# Patient Record
Sex: Female | Born: 1970 | Race: White | Hispanic: No | Marital: Married | Smoking: Never smoker
Health system: Southern US, Community
[De-identification: ages and names within clinical notes are randomized; demographics above are authoritative.]

## PROBLEM LIST (undated history)

## (undated) DIAGNOSIS — R000 Tachycardia, unspecified: Secondary | ICD-10-CM

## (undated) DIAGNOSIS — J45909 Unspecified asthma, uncomplicated: Secondary | ICD-10-CM

## (undated) DIAGNOSIS — E039 Hypothyroidism, unspecified: Secondary | ICD-10-CM

## (undated) DIAGNOSIS — H1010 Acute atopic conjunctivitis, unspecified eye: Secondary | ICD-10-CM

## (undated) DIAGNOSIS — J309 Allergic rhinitis, unspecified: Secondary | ICD-10-CM

## (undated) HISTORY — DX: Hypothyroidism, unspecified: E03.9

## (undated) HISTORY — DX: Acute atopic conjunctivitis, unspecified eye: H10.10

## (undated) HISTORY — DX: Allergic rhinitis, unspecified: J30.9

## (undated) HISTORY — DX: Tachycardia, unspecified: R00.0

## (undated) HISTORY — DX: Unspecified asthma, uncomplicated: J45.909

## (undated) HISTORY — PX: REDUCTION MAMMAPLASTY: SUR839

## (undated) HISTORY — PX: BREAST REDUCTION SURGERY: SHX8

## (undated) HISTORY — PX: OTHER SURGICAL HISTORY: SHX169

## (undated) HISTORY — PX: BREAST EXCISIONAL BIOPSY: SUR124

---

## 2003-04-14 ENCOUNTER — Other Ambulatory Visit: Admission: RE | Admit: 2003-04-14 | Discharge: 2003-04-14 | Payer: Self-pay | Admitting: Gynecology

## 2003-10-17 ENCOUNTER — Inpatient Hospital Stay (HOSPITAL_COMMUNITY): Admission: AD | Admit: 2003-10-17 | Discharge: 2003-10-21 | Payer: Self-pay | Admitting: Gynecology

## 2003-10-24 ENCOUNTER — Encounter: Admission: RE | Admit: 2003-10-24 | Discharge: 2003-11-23 | Payer: Self-pay | Admitting: Gynecology

## 2003-11-29 ENCOUNTER — Other Ambulatory Visit: Admission: RE | Admit: 2003-11-29 | Discharge: 2003-11-29 | Payer: Self-pay | Admitting: Gynecology

## 2003-12-22 ENCOUNTER — Encounter: Admission: RE | Admit: 2003-12-22 | Discharge: 2004-01-21 | Payer: Self-pay | Admitting: Gynecology

## 2005-01-03 ENCOUNTER — Other Ambulatory Visit: Admission: RE | Admit: 2005-01-03 | Discharge: 2005-01-03 | Payer: Self-pay | Admitting: Gynecology

## 2005-05-09 ENCOUNTER — Encounter: Admission: RE | Admit: 2005-05-09 | Discharge: 2005-05-09 | Payer: Self-pay | Admitting: Gynecology

## 2006-01-23 ENCOUNTER — Other Ambulatory Visit: Admission: RE | Admit: 2006-01-23 | Discharge: 2006-01-23 | Payer: Self-pay | Admitting: Gynecology

## 2006-05-15 ENCOUNTER — Encounter: Admission: RE | Admit: 2006-05-15 | Discharge: 2006-05-15 | Payer: Self-pay | Admitting: General Surgery

## 2007-02-01 ENCOUNTER — Other Ambulatory Visit: Admission: RE | Admit: 2007-02-01 | Discharge: 2007-02-01 | Payer: Self-pay | Admitting: Gynecology

## 2007-06-21 ENCOUNTER — Encounter: Admission: RE | Admit: 2007-06-21 | Discharge: 2007-06-21 | Payer: Self-pay | Admitting: General Surgery

## 2008-06-26 ENCOUNTER — Encounter: Admission: RE | Admit: 2008-06-26 | Discharge: 2008-06-26 | Payer: Self-pay | Admitting: General Surgery

## 2009-07-16 ENCOUNTER — Encounter: Admission: RE | Admit: 2009-07-16 | Discharge: 2009-07-16 | Payer: Self-pay | Admitting: Family Medicine

## 2010-09-30 ENCOUNTER — Encounter
Admission: RE | Admit: 2010-09-30 | Discharge: 2010-09-30 | Payer: Self-pay | Source: Home / Self Care | Attending: Internal Medicine | Admitting: Internal Medicine

## 2011-01-17 NOTE — Discharge Summary (Signed)
NAME:  Barbara Jordan, Barbara Jordan NO.:  [id number]   MEDICAL RECORD NO.:  [id number]                   PATIENT TYPE:  INP   LOCATION:  9108                                 FACILITY:  [facility]   PHYSICIAN:  Timothy P. Fontaine, M.D.           DATE OF BIRTH:  05/07/1971   DATE OF ADMISSION:  10/17/2003  DATE OF DISCHARGE:  10/21/2003                                 DISCHARGE SUMMARY   DISCHARGE DIAGNOSES:  1. Intrauterine pregnancy at 39+ weeks, delivered.  2. Arrest of dilatation and descent.  3. Status post primary cesarean section low flap transverse by Dr. Douglass Rivers on October 18, 2003.  4. Positive group B Streptococcus.   HISTORY:  This is a 31-years-of-age female gravida 1 with an EDC of October 20, 2003.  Prenatal course had been uncomplicated.  The patient presented  for elective induction of labor for social reasons as the patient had been  asked to leave her job because of pregnancy at 38 weeks and currently using  up her maternity leave and did not go into spontaneous labor at that point.   HOSPITAL COURSE:  On October 16, 2003 the patient was admitted that p.m.  Cervidil was placed.  The following a.m. it was removed and begun on  Pitocin.  She was also begun on clindamycin per protocol for positive group  B strep.  Subsequently, however, the diagnosis of arrest of dilatation and  descent was made and therefore the patient underwent a primary cesarean  section low flap transverse by Dr. Douglass Rivers on October 18, 2003 and  underwent delivery of a female, Apgars of 9 and 9, weight of 6 pounds 9  ounces.  There were no complications.  Postoperatively the patient remained  afebrile, voiding, in stable condition, and on October 21, 2003 the patient  was discharged to home in satisfactory condition and given [facility]  Gynecology postpartum instructions and postpartum booklet.   ACCESSORY CLINICAL FINDINGS/LABORATORY DATA:  The patient is A  positive,  rubella immune.  On October 19, 2003 hemoglobin 7.5.   DISPOSITION:  The patient was discharged to home, informed to return to the  office in 6 weeks, given a prescription of Tylox p.r.n. pain.     Susa Loffler, P.A.                    Timothy P. Audie Box, M.D.    Ardath Sax  D:  11/20/2003  T:  11/20/2003  Job:  [id number]

## 2011-01-17 NOTE — H&P (Signed)
NAME:  TRINI, SOLDO NO.:  [id number]   MEDICAL RECORD NO.:  [id number]                   PATIENT TYPE:   LOCATION:                                       FACILITY:   PHYSICIAN:  Ivor Costa. Farrel Gobble, M.D.              DATE OF BIRTH:   DATE OF ADMISSION:  DATE OF DISCHARGE:                                HISTORY & PHYSICAL   CHIEF COMPLAINT:  Term pregnancy.   HISTORY OF PRESENT ILLNESS:  The patient is a 40 year old G1 with an LMP of  Jan 14, 2003; estimated date of confinement of October 20, 2003; estimated  gestational age of 51 and five-sevenths weeks who presents for an elective  induction of labor for social reasons as the patient has been asked to leave  her job because of the pregnancy at 38 weeks and is currently using up her  maternity leave and has not gone into spontaneous labor at this point.  The  patient reports good fetal movement, no vaginal bleeding, no contractions.  Her pregnancy was without any complications.   PRENATAL LABORATORY DATA:  She is O positive, antibody negative.  RPR  nonreactive.  Rubella immune.  Hepatitis B surface antigen nonreactive.  HIV  nonreactive.  GBS positive.  AFP within normal limits.   Of note, the patient has a LATEX ALLERGY.   Please refer to the [facility].   PHYSICAL EXAMINATION:  GENERAL:  The patient is a well-appearing gravida.  VITAL SIGNS:  Her weight is 176 which is a 30-pound weight gain.  Urine dip  was negative.  Blood pressure was 110/72.  HEART:  Regular rate.  LUNGS:  Clear to auscultation.  ABDOMEN:  Gravid, soft and nontender, with heart tones auscultated.  Fundal  height was 37.  PELVIC:  On vaginal exam she was fingertip, 50%, mid position, and vertex.  EXTREMITIES:  Negative.   ASSESSMENT:  Elective induction secondary to loss of wages.  The patient  will present for Cervidil this evening, followed by high-dose Pitocin in the  morning.  Risks and benefits of induction were  discussed and accepted.                                               Ivor Costa. Farrel Gobble, M.D.    THL/MEDQ  D:  10/17/2003  T:  10/17/2003  Job:  [id number]

## 2011-01-17 NOTE — Op Note (Signed)
NAME:  Barbara Jordan, Barbara Jordan NO.:  [id number]   MEDICAL RECORD NO.:  [id number]                   PATIENT TYPE:  INP   LOCATION:  9108                                 [facility]ACILITY:  [facility]   PHYSICIAN:  Ivor Costa. Farrel Gobble, M.D.              DATE OF BIRTH:  Jan 27, 1971   DATE OF PROCEDURE:  10/18/2003  DATE OF DISCHARGE:                                 OPERATIVE REPORT   PREOPERATIVE DIAGNOSIS:  Arrest of dilation and descent.   POSTOPERATIVE DIAGNOSIS:  Arrest of dilation and descent.   PROCEDURE:  Primary cesarean section, low flap transverse.   SURGEON:  Ivor Costa. Farrel Gobble, M.D.   ANESTHESIA:  Epidural.   FLUIDS REPLACED:  1200 mL lactated Ringer's.   ESTIMATED BLOOD LOSS:  400 mL.   URINE OUTPUT:  200 mL clear urine.   FINDINGS:  A viable female infant in the OP presentation, Apgars 8 and 9,  birth weight 6 pound 9 ounces.  Normal uterus, tubes, and ovaries.   COMPLICATIONS:  None.   PATHOLOGY:  None.   DESCRIPTION OF PROCEDURE:  The patient was taken to the operating room and  placed in the supine position with left lateral displacement, prepped and  draped in the usual sterile fashion.  After adequate anesthesia was ensured,  a Pfannenstiel skin incision was made with a scalpel and carried to the  underlying layer of fascia with electrocautery.  The fascia was scored in  the midline and the incision was extended laterally with electrocautery.  The inferior aspect of the fascial incision was grasped with Kochers.  Underlying rectus muscles were dissected off with blunt and sharp  dissection.  In a similar fashion the superior aspect of the incision was  grasped with Kochers, the underlying rectus muscles were dissected off.  The  rectus muscles were separated in the midline and the peritoneum was  identified and entered bluntly.  The peritoneal incision was then extended  superiorly and inferiorly with good visualization of the underlying bowel  and  bladder.  The orientation of the uterus was confirmed.  The bladder  blade was inserted, the vesicouterine peritoneum was identified and entered  sharply with the Metzenbaums.  The incision was extended laterally, the  bladder flap was created digitally, the bladder blade was then reinserted.  The lower uterine segment was incised in a transverse fashion with a scalpel  and noted to be markedly thinned.  The incision was extended bluntly.  The  infant was delivered from the direct OP presentation.  The cord was clamped  and cut and handed off to the awaiting pediatricians.  Spontaneous cries  were noted.  Cord bloods were obtained.  The uterus was massaged and the  placenta was allowed to separate naturally.  The uterus was cleared of all  clots and debris.  The uterine incision was then repaired with a running  locked layer of 0 chromic and noted to  be hemostatic afterwards.  The pelvis  was irrigated with copious amounts of warm saline.  The adnexa were  inspected and noted to be unremarkable.  The gutters were cleared of all  clots and debris.  Inspection of the remainder of the pelvis assured [place] of  hemostasis.  The peritoneum and muscles were also inspected and noted to be  hemostatic.  The fascia was then closed with 0 Vicryl in a running fashion.  The subcu was irrigated.  The skin was closed with staples.  The patient  tolerated the procedure well.  The sponge, lap, and needle counts were  correct x2, and she was transferred to the PACU in stable condition.  The  patient received Cipro preoperatively.                                               Ivor Costa. Farrel Gobble, M.D.    THL/MEDQ  D:  10/18/2003  T:  10/19/2003  Job:  [id number]

## 2011-10-24 ENCOUNTER — Other Ambulatory Visit: Payer: Self-pay | Admitting: Obstetrics and Gynecology

## 2011-10-24 DIAGNOSIS — Z1231 Encounter for screening mammogram for malignant neoplasm of breast: Secondary | ICD-10-CM

## 2011-11-20 ENCOUNTER — Ambulatory Visit
Admission: RE | Admit: 2011-11-20 | Discharge: 2011-11-20 | Disposition: A | Discharge status: Home / Self Care | Payer: PRIVATE HEALTH INSURANCE | Source: Ambulatory Visit | Attending: Obstetrics and Gynecology | Admitting: Obstetrics and Gynecology

## 2011-11-20 DIAGNOSIS — Z1231 Encounter for screening mammogram for malignant neoplasm of breast: Secondary | ICD-10-CM

## 2012-10-20 ENCOUNTER — Other Ambulatory Visit: Payer: Self-pay | Admitting: Obstetrics and Gynecology

## 2012-12-24 ENCOUNTER — Ambulatory Visit: Payer: PRIVATE HEALTH INSURANCE

## 2013-12-02 ENCOUNTER — Ambulatory Visit: Payer: PRIVATE HEALTH INSURANCE

## 2013-12-07 ENCOUNTER — Ambulatory Visit
Admission: RE | Admit: 2013-12-07 | Discharge: 2013-12-07 | Disposition: A | Discharge status: Home / Self Care | Payer: PRIVATE HEALTH INSURANCE | Source: Ambulatory Visit | Attending: Obstetrics and Gynecology | Admitting: Obstetrics and Gynecology

## 2013-12-07 DIAGNOSIS — Z1231 Encounter for screening mammogram for malignant neoplasm of breast: Secondary | ICD-10-CM

## 2015-02-06 ENCOUNTER — Other Ambulatory Visit: Payer: Self-pay

## 2015-02-06 DIAGNOSIS — Z1231 Encounter for screening mammogram for malignant neoplasm of breast: Secondary | ICD-10-CM

## 2015-03-09 ENCOUNTER — Ambulatory Visit
Admission: RE | Admit: 2015-03-09 | Discharge: 2015-03-09 | Disposition: A | Discharge status: Home / Self Care | Payer: PRIVATE HEALTH INSURANCE | Source: Ambulatory Visit

## 2015-03-09 DIAGNOSIS — Z1231 Encounter for screening mammogram for malignant neoplasm of breast: Secondary | ICD-10-CM

## 2015-06-01 ENCOUNTER — Ambulatory Visit (INDEPENDENT_AMBULATORY_CARE_PROVIDER_SITE_OTHER): Payer: PRIVATE HEALTH INSURANCE

## 2015-06-01 DIAGNOSIS — J3089 Other allergic rhinitis: Secondary | ICD-10-CM | POA: Diagnosis not present

## 2015-06-11 ENCOUNTER — Ambulatory Visit (INDEPENDENT_AMBULATORY_CARE_PROVIDER_SITE_OTHER): Payer: PRIVATE HEALTH INSURANCE

## 2015-06-11 DIAGNOSIS — J309 Allergic rhinitis, unspecified: Secondary | ICD-10-CM | POA: Diagnosis not present

## 2015-06-21 ENCOUNTER — Ambulatory Visit (INDEPENDENT_AMBULATORY_CARE_PROVIDER_SITE_OTHER): Payer: PRIVATE HEALTH INSURANCE | Admitting: *Deleted

## 2015-06-21 DIAGNOSIS — J309 Allergic rhinitis, unspecified: Secondary | ICD-10-CM | POA: Diagnosis not present

## 2015-06-29 ENCOUNTER — Ambulatory Visit (INDEPENDENT_AMBULATORY_CARE_PROVIDER_SITE_OTHER): Payer: PRIVATE HEALTH INSURANCE | Admitting: *Deleted

## 2015-06-29 DIAGNOSIS — J309 Allergic rhinitis, unspecified: Secondary | ICD-10-CM | POA: Diagnosis not present

## 2015-07-05 DIAGNOSIS — J3089 Other allergic rhinitis: Secondary | ICD-10-CM | POA: Diagnosis not present

## 2015-07-06 DIAGNOSIS — J301 Allergic rhinitis due to pollen: Secondary | ICD-10-CM | POA: Diagnosis not present

## 2015-07-16 ENCOUNTER — Ambulatory Visit (INDEPENDENT_AMBULATORY_CARE_PROVIDER_SITE_OTHER): Payer: PRIVATE HEALTH INSURANCE | Admitting: *Deleted

## 2015-07-16 DIAGNOSIS — J309 Allergic rhinitis, unspecified: Secondary | ICD-10-CM

## 2015-07-30 ENCOUNTER — Encounter: Payer: Self-pay | Admitting: Allergy and Immunology

## 2015-07-30 ENCOUNTER — Ambulatory Visit (INDEPENDENT_AMBULATORY_CARE_PROVIDER_SITE_OTHER): Payer: PRIVATE HEALTH INSURANCE | Admitting: Allergy and Immunology

## 2015-07-30 VITALS — BP 110/70 | HR 60 | Resp 16 | Ht 65.5 in | Wt 153.4 lb

## 2015-07-30 DIAGNOSIS — J309 Allergic rhinitis, unspecified: Secondary | ICD-10-CM | POA: Diagnosis not present

## 2015-07-30 DIAGNOSIS — H1010 Acute atopic conjunctivitis, unspecified eye: Secondary | ICD-10-CM | POA: Insufficient documentation

## 2015-07-30 DIAGNOSIS — J4540 Moderate persistent asthma, uncomplicated: Secondary | ICD-10-CM

## 2015-07-30 NOTE — Patient Instructions (Addendum)
  1. Continue Immunotherapy and Epi-Pen  2. Consistently use Asmanex 220 one inhalation one time per day. Increase to two inhalations two times per day during "flare up".  3. Continue flonase 1-2 sprays each nostril 3-7 times per week.  4. Continue antihistamine and ProAir HFA if needed.   5. Return in 6 months or earlier if there is a problem.

## 2015-07-30 NOTE — Progress Notes (Signed)
[facility]  Follow-up Note  Refering Provider: No ref. provider found Primary Provider: Philemon KingdomPROCHNAU,CAROLINE, MD  Subjective:   Barbara DonathKatherine Jordan is a 44 y.o. female who [facility] the [facility] in re-evaluation of the following:  HPI Comments:  Barbara Jordan returns to this clinic on 07/30/2015 in reevaluation of her asthma and allergic rhinoconjunctivitis. She is receiving immunotherapy to treat both these conditions and is done quite well with this form of therapy and has had no difficulty using this form of therapy. She does have an EpiPen. She continues on Asmanex 220 one inhalation 3 times per week and she exercises 3-4 times per week on the elliptical trainer without any problem. Her nose is not been causing her any problem while using Flonase about 3 times per week. She did apparently contract a pneumonia this summer requiring administration of antibiotic by the urgent care center. Apparently this pneumonia was confirmed with a abnormal chest x-ray.   Outpatient Encounter Prescriptions as of 07/30/2015  Medication Sig  . Albuterol Sulfate (PROAIR RESPICLICK) 108 (90 BASE) MCG/ACT AEPB Inhale into the lungs. Inhale two puffs every four to six hours as needed for cough or wheeze.  Mack Guise. ARMOUR THYROID 60 MG tablet Take 60 mg by mouth daily.  . B Complex Vitamins (B COMPLEX-B12 PO) Take by mouth daily.  . Cholecalciferol (VITAMIN D PO) Take by mouth.  . Coenzyme Q10 (COQ10 PO) Take by mouth.  . EPINEPHrine (EPIPEN 2-PAK IJ) Inject as directed as needed.  Marland Kitchen. Fexofenadine HCl (ALLEGRA ALLERGY PO) Take 1 tablet by mouth daily.  . fluticasone (FLONASE) 50 MCG/ACT nasal spray Place 1 spray into both nostrils daily.  Marland Kitchen. liothyronine (CYTOMEL) 5 MCG tablet   . LYSINE PO Take by mouth. C-Lysine  . MAGNESIUM CHLORIDE ER PO Take by mouth.  . Misc Natural Products (ADRENAL PO) Take by mouth.  . mometasone (ASMANEX 120 METERED DOSES) 220  MCG/INH inhaler Inhale 1 puff into the lungs daily.  . Multiple Vitamins-Minerals (WHOLE FOOD MULTIVITAMIN PO) Take by mouth.  Marland Kitchen. NALTREXONE HCL PO Take by mouth daily.  Marland Kitchen. NASAL SALINE NA Place into the nose as needed.  . Omega-3 Fatty Acids (FISH OIL PO) Take by mouth daily.  Marland Kitchen. OVER THE COUNTER MEDICATION Iodoral  . Probiotic Product (PROBIOTIC DAILY PO) Take by mouth.  . progesterone (PROMETRIUM) 100 MG capsule   . SYNTHROID 50 MCG tablet   . TURMERIC PO Take by mouth.  . [DISCONTINUED] Albuterol Sulfate (VENTOLIN HFA IN) Inhale into the lungs as needed.  . [DISCONTINUED] ARMOUR THYROID PO Take 1 tablet by mouth 2 (two) times daily.  . [DISCONTINUED] Liothyronine Sodium (CYTOMEL PO) Take by mouth 2 (two) times daily.   No facility-administered encounter medications on file as of 07/30/2015.    No orders of the defined types were placed in this encounter.    Past Medical History  Diagnosis Date  . Hypothyroidism   . Asthma   . Allergic rhinoconjunctivitis     Past Surgical History  Procedure Laterality Date  . Cesarean section    . Breast reduction surgery    . Biospy      Fibrocystic breast disease with biopsy    Allergies  Allergen Reactions  . Hydrocodone   . Keflex [Cephalexin] Hives  . Latex     Review of Systems  Constitutional: Negative.   HENT: Negative.   Eyes: Negative.   Respiratory: Negative.   Cardiovascular: Negative.  Gastrointestinal: Negative.   Musculoskeletal: Positive for arthralgias (Right knee meniscus tear).  Skin: Negative.      Objective:   Filed Vitals:   07/30/15 0850  BP: 110/70  Pulse: 60  Resp: 16   Height: 5' 5.5" (166.4 cm)  Weight: 153 lb 7 oz (69.6 kg)   Physical Exam  Constitutional: She appears well-developed and well-nourished. No distress.  HENT:  Head: Normocephalic and atraumatic. Head is without right periorbital erythema and without left periorbital erythema.  Right Ear: Tympanic membrane, external ear and  ear canal normal. No drainage or tenderness. No foreign bodies. Tympanic membrane is not injected, not scarred, not perforated, not erythematous, not retracted and not bulging. No middle ear effusion.  Left Ear: Tympanic membrane, external ear and ear canal normal. No drainage or tenderness. No foreign bodies. Tympanic membrane is not injected, not scarred, not perforated, not erythematous, not retracted and not bulging.  No middle ear effusion.  Nose: Nose normal. No mucosal edema, rhinorrhea, nose lacerations or sinus tenderness.  No foreign bodies.  Mouth/Throat: Oropharynx is clear and moist. No oropharyngeal exudate, posterior oropharyngeal edema, posterior oropharyngeal erythema or tonsillar abscesses.  Eyes: Lids are normal. Right eye exhibits no chemosis, no discharge and no exudate. No foreign body present in the right eye. Left eye exhibits no chemosis, no discharge and no exudate. No foreign body present in the left eye. Right conjunctiva is not injected. Left conjunctiva is not injected.  Neck: Neck supple. No tracheal tenderness present. No tracheal deviation and no edema present. No thyroid mass and no thyromegaly present.  Cardiovascular: Normal rate, regular rhythm, S1 normal and S2 normal.  Exam reveals no gallop.   No murmur heard. Pulmonary/Chest: No accessory muscle usage or stridor. No respiratory distress. She has no wheezes. She has no rhonchi. She has no rales.  Abdominal: Soft.  Lymphadenopathy:       Head (right side): No tonsillar adenopathy present.       Head (left side): No tonsillar adenopathy present.    She has no cervical adenopathy.  Neurological: She is alert.  Skin: No rash noted. She is not diaphoretic.  Psychiatric: She has a normal mood and affect. Her behavior is normal.    Diagnostics:    Spirometry was performed and demonstrated an FEV1 of 2.12 at 68 % of predicted.  The patient had an Asthma Control Test with the following results: ACT Total Score:  23.    Assessment and Plan:   1. Moderate persistent asthma, uncomplicated   2. Allergic rhinoconjunctivitis      1. Continue Immunotherapy and Epi-Pen  2. Consistently use Asmanex 220 one inhalation one time per day. Increase to two inhalations two times per day during "flare up".  3. Continue flonase 1-2 sprays each nostril 3-7 times per week.  4. Continue antihistamine and ProAir HFA if needed.   5. Return in 6 months.  Barbara Jordan is doing well symptomatically her spirometry is depressed and I had a talk with her today about the need to consistently use her Asmanex every day in an attempt to prevent her from developing a significant asthma exacerbation whenever she does develop an upper respiratory tract infection in the future. 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 She appeared to agree with this recommendation will be using her Asmanex every day. I will see her back in this clinic in 6 months or earlier if there is a problem.  Allena Katz, MD [place]

## 2015-08-13 ENCOUNTER — Ambulatory Visit (INDEPENDENT_AMBULATORY_CARE_PROVIDER_SITE_OTHER): Payer: PRIVATE HEALTH INSURANCE | Admitting: *Deleted

## 2015-08-13 DIAGNOSIS — J4540 Moderate persistent asthma, uncomplicated: Secondary | ICD-10-CM

## 2015-08-30 ENCOUNTER — Ambulatory Visit (INDEPENDENT_AMBULATORY_CARE_PROVIDER_SITE_OTHER): Payer: PRIVATE HEALTH INSURANCE

## 2015-08-30 DIAGNOSIS — J309 Allergic rhinitis, unspecified: Secondary | ICD-10-CM | POA: Diagnosis not present

## 2015-09-13 ENCOUNTER — Ambulatory Visit (INDEPENDENT_AMBULATORY_CARE_PROVIDER_SITE_OTHER): Payer: PRIVATE HEALTH INSURANCE | Admitting: *Deleted

## 2015-09-13 DIAGNOSIS — J309 Allergic rhinitis, unspecified: Secondary | ICD-10-CM

## 2015-09-21 ENCOUNTER — Ambulatory Visit (INDEPENDENT_AMBULATORY_CARE_PROVIDER_SITE_OTHER): Payer: PRIVATE HEALTH INSURANCE | Admitting: *Deleted

## 2015-09-21 DIAGNOSIS — J309 Allergic rhinitis, unspecified: Secondary | ICD-10-CM

## 2015-09-27 ENCOUNTER — Ambulatory Visit (INDEPENDENT_AMBULATORY_CARE_PROVIDER_SITE_OTHER): Payer: PRIVATE HEALTH INSURANCE | Admitting: *Deleted

## 2015-09-27 DIAGNOSIS — J309 Allergic rhinitis, unspecified: Secondary | ICD-10-CM

## 2015-10-05 ENCOUNTER — Ambulatory Visit (INDEPENDENT_AMBULATORY_CARE_PROVIDER_SITE_OTHER): Payer: PRIVATE HEALTH INSURANCE

## 2015-10-05 DIAGNOSIS — J309 Allergic rhinitis, unspecified: Secondary | ICD-10-CM | POA: Diagnosis not present

## 2015-10-11 ENCOUNTER — Ambulatory Visit (INDEPENDENT_AMBULATORY_CARE_PROVIDER_SITE_OTHER): Payer: PRIVATE HEALTH INSURANCE

## 2015-10-11 DIAGNOSIS — J309 Allergic rhinitis, unspecified: Secondary | ICD-10-CM | POA: Diagnosis not present

## 2015-10-26 ENCOUNTER — Ambulatory Visit (INDEPENDENT_AMBULATORY_CARE_PROVIDER_SITE_OTHER): Payer: PRIVATE HEALTH INSURANCE

## 2015-10-26 DIAGNOSIS — J309 Allergic rhinitis, unspecified: Secondary | ICD-10-CM

## 2015-11-02 ENCOUNTER — Telehealth: Payer: Self-pay | Admitting: Allergy and Immunology

## 2015-11-02 ENCOUNTER — Other Ambulatory Visit: Payer: Self-pay | Admitting: *Deleted

## 2015-11-02 MED ORDER — PREDNISONE 10 MG PO TABS: ORAL_TABLET | ORAL | Status: DC

## 2015-11-02 NOTE — Telephone Encounter (Signed)
Pt is aware.  

## 2015-11-02 NOTE — Telephone Encounter (Signed)
Having some tightness in chest, wheezing and coughing.  [facility] [place]

## 2015-11-02 NOTE — Telephone Encounter (Signed)
Okay to call out prednisone baby pack. She should use her Asmanex 220 g 1 inhalation daily on a regular basis along with as needed albuterol

## 2015-11-02 NOTE — Telephone Encounter (Signed)
Olegario MessierKathy has requested a dose of Prednisone ... Please advise Dr. Clydie BraunBhatti. Thanks!

## 2015-11-02 NOTE — Telephone Encounter (Signed)
PREDNISONE BABY PACK PER DR. BHATTI

## 2015-11-05 ENCOUNTER — Ambulatory Visit (INDEPENDENT_AMBULATORY_CARE_PROVIDER_SITE_OTHER): Payer: PRIVATE HEALTH INSURANCE | Admitting: Allergy and Immunology

## 2015-11-05 ENCOUNTER — Encounter: Payer: Self-pay | Admitting: Allergy and Immunology

## 2015-11-05 VITALS — BP 108/54 | HR 60 | Resp 16

## 2015-11-05 DIAGNOSIS — J309 Allergic rhinitis, unspecified: Secondary | ICD-10-CM | POA: Diagnosis not present

## 2015-11-05 DIAGNOSIS — H1010 Acute atopic conjunctivitis, unspecified eye: Secondary | ICD-10-CM

## 2015-11-05 DIAGNOSIS — J4531 Mild persistent asthma with (acute) exacerbation: Secondary | ICD-10-CM | POA: Diagnosis not present

## 2015-11-05 MED ORDER — METHYLPREDNISOLONE ACETATE 80 MG/ML IJ SUSP
80.0000 mg | Freq: Once | INTRAMUSCULAR | Status: AC
  Administered 2015-11-05: 80 mg via INTRAMUSCULAR

## 2015-11-05 NOTE — Telephone Encounter (Signed)
Pt called back this morning stating she is still not any better and would like advice on what to do.

## 2015-11-05 NOTE — Telephone Encounter (Signed)
Please have her come in at end of day today

## 2015-11-05 NOTE — Patient Instructions (Addendum)
  1. Depomedrol 80 IM delivered in clinic today  2. Nasal saline multiple times per day  3. Claritin 10 mg one time per day  4. Mucinex DM two tablets two times per day  5. Continue high dose asmanex.  6. Continue all other medications  7. Further treatment?

## 2015-11-05 NOTE — Progress Notes (Signed)
Follow-up Note  Referring Provider: Philemon Kingdom, MD Primary Provider: Philemon Kingdom, MD Date of Office Visit: 11/05/2015  Subjective:   Barbara Jordan (DOB: 12/31/1970) is a 45 y.o. female who [facility] on 11/05/2015 in re-evaluation of the following:  HPI Comments: Barbara Ang returns to this clinic in reevaluation of her asthma and allergic rhinoconjunctivitis treated with immunotherapy. Last Thursday she started to develop problems with coughing and nasal congestion and rhinorrhea that relatively clear. She then developed significant wheezing and shortness of breath and had to use her bronchodilator. She contacted the on-call doctor who gave her prednisone for several days and she's been using her bronchodilator twice a day. Her symptoms do appear to respond to a bronchodilator somewhat. She's felt really bad this whole weekend and she's had a decreased ability to smell as well but no obvious fever.     Outpatient Prescriptions Prior to Visit  Medication Sig Dispense Refill  . Albuterol Sulfate (PROAIR RESPICLICK) 108 (90 BASE) MCG/ACT AEPB Inhale into the lungs. Inhale two puffs every four to six hours as needed for cough or wheeze.    Mack Guise THYROID 60 MG tablet Take 60 mg by mouth daily.  0  . B Complex Vitamins (B COMPLEX-B12 PO) Take by mouth daily.    . Cholecalciferol (VITAMIN D PO) Take by mouth.    . Coenzyme Q10 (COQ10 PO) Take by mouth.    . EPINEPHrine (EPIPEN 2-PAK IJ) Inject as directed as needed.    Marland Kitchen Fexofenadine HCl (ALLEGRA ALLERGY PO) Take 1 tablet by mouth daily.    . fluticasone (FLONASE) 50 MCG/ACT nasal spray Place 1 spray into both nostrils daily.    Marland Kitchen liothyronine (CYTOMEL) 5 MCG tablet   0  . LYSINE PO Take by mouth. C-Lysine    . MAGNESIUM CHLORIDE ER PO Take by mouth.    . Misc Natural Products (ADRENAL PO) Take by mouth.    . mometasone (ASMANEX 120 METERED DOSES) 220 MCG/INH inhaler Inhale 1 puff into the  lungs daily.    . Multiple Vitamins-Minerals (WHOLE FOOD MULTIVITAMIN PO) Take by mouth.    Marland Kitchen NALTREXONE HCL PO Take by mouth daily.    Marland Kitchen NASAL SALINE NA Place into the nose as needed.    . Omega-3 Fatty Acids (FISH OIL PO) Take by mouth daily.    Marland Kitchen OVER THE COUNTER MEDICATION Iodoral    . predniSONE (DELTASONE) 10 MG tablet TAKE TWO TABLETS ONCE DAILY FOR 4 DAYS THEN ONE TABLET ON THE 5TH DAY 9 tablet 0  . Probiotic Product (PROBIOTIC DAILY PO) Take by mouth.    . progesterone (PROMETRIUM) 100 MG capsule   0  . SYNTHROID 50 MCG tablet   0  . TURMERIC PO Take by mouth.     No facility-administered medications prior to visit.    Past Medical History  Diagnosis Date  . Hypothyroidism   . Asthma   . Allergic rhinoconjunctivitis     Past Surgical History  Procedure Laterality Date  . Cesarean section    . Breast reduction surgery    . Biospy      Fibrocystic breast disease with biopsy    Allergies  Allergen Reactions  . Hydrocodone   . Keflex [Cephalexin] Hives  . Latex     Review of systems negative except as noted in HPI / PMHx or noted below:  Review of Systems  Constitutional: Negative.   HENT: Negative.   Eyes: Negative.  Respiratory: Negative.   Cardiovascular: Negative.   Gastrointestinal: Negative.   Genitourinary: Negative.   Musculoskeletal: Negative.   Skin: Negative.   Neurological: Negative.   Endo/Heme/Allergies: Negative.   Psychiatric/Behavioral: Negative.      Objective:   Filed Vitals:   11/05/15 1529  BP: 108/54  Pulse: 60  Resp: 16          Physical Exam  Constitutional: She is well-developed, well-nourished, and in no distress.  Nasal voice slight cough  HENT:  Head: Normocephalic.  Right Ear: Tympanic membrane, external ear and ear canal normal.  Left Ear: Tympanic membrane, external ear and ear canal normal.  Nose: Mucosal edema (Erythematous) present. No rhinorrhea.  Mouth/Throat: Uvula is midline, oropharynx is clear and  moist and mucous membranes are normal. No oropharyngeal exudate.  Eyes: Conjunctivae are normal.  Neck: Trachea normal. No tracheal tenderness present. No tracheal deviation present. No thyromegaly present.  Cardiovascular: Normal rate, regular rhythm, S1 normal, S2 normal and normal heart sounds.   No murmur heard. Pulmonary/Chest: Breath sounds normal. No stridor. No respiratory distress. She has no wheezes. She has no rales.  Musculoskeletal: She exhibits no edema.  Lymphadenopathy:       Head (right side): No tonsillar adenopathy present.       Head (left side): No tonsillar adenopathy present.    She has no cervical adenopathy.    She has no axillary adenopathy.  Neurological: She is alert. Gait normal.  Skin: No rash noted. She is not diaphoretic. No erythema. Nails show no clubbing.  Psychiatric: Mood and affect normal.    Diagnostics:    Spirometry was performed and demonstrated an FEV1 of 1.46 at 46 % of predicted.  The patient had an Asthma Control Test with the following results: ACT Total Score: 13.    Assessment and Plan:   1. Asthma, not well controlled, mild persistent, with acute exacerbation   2. Allergic rhinoconjunctivitis     1. Depomedrol 80 IM delivered in clinic today  2. Nasal saline multiple times per day  3. Claritin 10 mg one time per day  4. Mucinex DM two tablets two times per day  5. Continue high dose asmanex while "sick".  6. Continue all other medications  7. Further treatment?  Barbara Jordan appears to have developed a flare of her asthma secondary to a viral respiratory tract infection and she will utilize the therapy described above and I will see her back in this clinic in approximately 6 months or earlier if there is a problem.  Laurette SchimkeEric Kozlow, MD [facility]

## 2015-11-05 NOTE — Telephone Encounter (Signed)
Patient came in for appt.

## 2015-11-08 ENCOUNTER — Telehealth: Payer: Self-pay | Admitting: Allergy and Immunology

## 2015-11-08 ENCOUNTER — Other Ambulatory Visit: Payer: Self-pay

## 2015-11-08 ENCOUNTER — Other Ambulatory Visit: Payer: Self-pay | Admitting: *Deleted

## 2015-11-08 MED ORDER — PREDNISONE 10 MG PO TABS: ORAL_TABLET | ORAL | Status: DC

## 2015-11-08 MED ORDER — AZITHROMYCIN 500 MG PO TABS: ORAL_TABLET | ORAL | Status: DC

## 2015-11-08 NOTE — Telephone Encounter (Signed)
Patient informed and RX sent.  

## 2015-11-08 NOTE — Telephone Encounter (Signed)
Please have patient use Azithromycin 500mg  tablet one time a day for three days plus prednisone 30mg  a day for three days.

## 2015-11-08 NOTE — Telephone Encounter (Signed)
Still wheezing, dry cough, very gurgly in chest.   Out of prednisone. Following action plan.   She wants to know if Dr Lucie LeatherKozlow can do something else or should she go to her primary?

## 2015-11-19 ENCOUNTER — Ambulatory Visit (INDEPENDENT_AMBULATORY_CARE_PROVIDER_SITE_OTHER): Payer: PRIVATE HEALTH INSURANCE | Admitting: *Deleted

## 2015-11-19 DIAGNOSIS — J309 Allergic rhinitis, unspecified: Secondary | ICD-10-CM | POA: Diagnosis not present

## 2015-11-22 DIAGNOSIS — J3089 Other allergic rhinitis: Secondary | ICD-10-CM | POA: Diagnosis not present

## 2015-11-23 DIAGNOSIS — J301 Allergic rhinitis due to pollen: Secondary | ICD-10-CM | POA: Diagnosis not present

## 2015-12-07 ENCOUNTER — Ambulatory Visit (INDEPENDENT_AMBULATORY_CARE_PROVIDER_SITE_OTHER): Payer: PRIVATE HEALTH INSURANCE | Admitting: *Deleted

## 2015-12-07 DIAGNOSIS — J309 Allergic rhinitis, unspecified: Secondary | ICD-10-CM | POA: Diagnosis not present

## 2015-12-20 ENCOUNTER — Ambulatory Visit (INDEPENDENT_AMBULATORY_CARE_PROVIDER_SITE_OTHER): Payer: PRIVATE HEALTH INSURANCE | Admitting: *Deleted

## 2015-12-20 DIAGNOSIS — J309 Allergic rhinitis, unspecified: Secondary | ICD-10-CM | POA: Diagnosis not present

## 2015-12-27 ENCOUNTER — Other Ambulatory Visit: Payer: Self-pay | Admitting: *Deleted

## 2015-12-27 MED ORDER — FLUTICASONE FUROATE 200 MCG/ACT IN AEPB
1.0000 | INHALATION_SPRAY | Freq: Every day | RESPIRATORY_TRACT | Status: DC
Start: 2015-12-27 — End: 2016-12-22

## 2016-01-10 ENCOUNTER — Ambulatory Visit (INDEPENDENT_AMBULATORY_CARE_PROVIDER_SITE_OTHER): Payer: PRIVATE HEALTH INSURANCE

## 2016-01-10 DIAGNOSIS — J309 Allergic rhinitis, unspecified: Secondary | ICD-10-CM

## 2016-01-21 ENCOUNTER — Ambulatory Visit (INDEPENDENT_AMBULATORY_CARE_PROVIDER_SITE_OTHER): Payer: PRIVATE HEALTH INSURANCE

## 2016-01-21 DIAGNOSIS — J309 Allergic rhinitis, unspecified: Secondary | ICD-10-CM | POA: Diagnosis not present

## 2016-01-31 ENCOUNTER — Ambulatory Visit (INDEPENDENT_AMBULATORY_CARE_PROVIDER_SITE_OTHER): Payer: PRIVATE HEALTH INSURANCE

## 2016-01-31 DIAGNOSIS — J309 Allergic rhinitis, unspecified: Secondary | ICD-10-CM

## 2016-02-04 ENCOUNTER — Ambulatory Visit: Payer: Self-pay | Admitting: Allergy and Immunology

## 2016-02-07 ENCOUNTER — Ambulatory Visit (INDEPENDENT_AMBULATORY_CARE_PROVIDER_SITE_OTHER): Payer: PRIVATE HEALTH INSURANCE | Admitting: *Deleted

## 2016-02-07 DIAGNOSIS — J309 Allergic rhinitis, unspecified: Secondary | ICD-10-CM | POA: Diagnosis not present

## 2016-02-14 ENCOUNTER — Ambulatory Visit (INDEPENDENT_AMBULATORY_CARE_PROVIDER_SITE_OTHER): Payer: PRIVATE HEALTH INSURANCE | Admitting: *Deleted

## 2016-02-14 DIAGNOSIS — J309 Allergic rhinitis, unspecified: Secondary | ICD-10-CM | POA: Diagnosis not present

## 2016-02-29 ENCOUNTER — Ambulatory Visit (INDEPENDENT_AMBULATORY_CARE_PROVIDER_SITE_OTHER): Payer: PRIVATE HEALTH INSURANCE | Admitting: *Deleted

## 2016-02-29 DIAGNOSIS — J309 Allergic rhinitis, unspecified: Secondary | ICD-10-CM | POA: Diagnosis not present

## 2016-03-06 ENCOUNTER — Ambulatory Visit (INDEPENDENT_AMBULATORY_CARE_PROVIDER_SITE_OTHER): Payer: PRIVATE HEALTH INSURANCE | Admitting: *Deleted

## 2016-03-06 DIAGNOSIS — J309 Allergic rhinitis, unspecified: Secondary | ICD-10-CM | POA: Diagnosis not present

## 2016-03-12 ENCOUNTER — Other Ambulatory Visit: Payer: Self-pay | Admitting: Obstetrics and Gynecology

## 2016-03-12 DIAGNOSIS — Z1231 Encounter for screening mammogram for malignant neoplasm of breast: Secondary | ICD-10-CM

## 2016-03-20 ENCOUNTER — Ambulatory Visit (INDEPENDENT_AMBULATORY_CARE_PROVIDER_SITE_OTHER): Payer: PRIVATE HEALTH INSURANCE | Admitting: *Deleted

## 2016-03-20 DIAGNOSIS — J309 Allergic rhinitis, unspecified: Secondary | ICD-10-CM | POA: Diagnosis not present

## 2016-03-25 DIAGNOSIS — J3089 Other allergic rhinitis: Secondary | ICD-10-CM | POA: Diagnosis not present

## 2016-03-26 DIAGNOSIS — J301 Allergic rhinitis due to pollen: Secondary | ICD-10-CM | POA: Diagnosis not present

## 2016-03-27 ENCOUNTER — Ambulatory Visit (INDEPENDENT_AMBULATORY_CARE_PROVIDER_SITE_OTHER): Payer: PRIVATE HEALTH INSURANCE | Admitting: *Deleted

## 2016-03-27 DIAGNOSIS — J309 Allergic rhinitis, unspecified: Secondary | ICD-10-CM | POA: Diagnosis not present

## 2016-04-07 ENCOUNTER — Ambulatory Visit: Payer: PRIVATE HEALTH INSURANCE

## 2016-04-10 ENCOUNTER — Ambulatory Visit (INDEPENDENT_AMBULATORY_CARE_PROVIDER_SITE_OTHER): Payer: PRIVATE HEALTH INSURANCE | Admitting: *Deleted

## 2016-04-10 DIAGNOSIS — J309 Allergic rhinitis, unspecified: Secondary | ICD-10-CM | POA: Diagnosis not present

## 2016-04-18 ENCOUNTER — Ambulatory Visit
Admission: RE | Admit: 2016-04-18 | Discharge: 2016-04-18 | Disposition: A | Discharge status: Home / Self Care | Payer: PRIVATE HEALTH INSURANCE | Source: Ambulatory Visit | Attending: Obstetrics and Gynecology | Admitting: Obstetrics and Gynecology

## 2016-04-18 DIAGNOSIS — Z1231 Encounter for screening mammogram for malignant neoplasm of breast: Secondary | ICD-10-CM

## 2016-04-24 ENCOUNTER — Ambulatory Visit (INDEPENDENT_AMBULATORY_CARE_PROVIDER_SITE_OTHER): Payer: PRIVATE HEALTH INSURANCE | Admitting: *Deleted

## 2016-04-24 DIAGNOSIS — J309 Allergic rhinitis, unspecified: Secondary | ICD-10-CM

## 2016-05-08 ENCOUNTER — Ambulatory Visit (INDEPENDENT_AMBULATORY_CARE_PROVIDER_SITE_OTHER): Payer: PRIVATE HEALTH INSURANCE | Admitting: *Deleted

## 2016-05-08 DIAGNOSIS — J309 Allergic rhinitis, unspecified: Secondary | ICD-10-CM

## 2016-05-22 ENCOUNTER — Ambulatory Visit (INDEPENDENT_AMBULATORY_CARE_PROVIDER_SITE_OTHER): Payer: PRIVATE HEALTH INSURANCE | Admitting: *Deleted

## 2016-05-22 DIAGNOSIS — J309 Allergic rhinitis, unspecified: Secondary | ICD-10-CM

## 2016-05-29 ENCOUNTER — Ambulatory Visit (INDEPENDENT_AMBULATORY_CARE_PROVIDER_SITE_OTHER): Payer: PRIVATE HEALTH INSURANCE | Admitting: *Deleted

## 2016-05-29 DIAGNOSIS — J309 Allergic rhinitis, unspecified: Secondary | ICD-10-CM | POA: Diagnosis not present

## 2016-06-05 ENCOUNTER — Ambulatory Visit (INDEPENDENT_AMBULATORY_CARE_PROVIDER_SITE_OTHER): Payer: PRIVATE HEALTH INSURANCE | Admitting: *Deleted

## 2016-06-05 DIAGNOSIS — J309 Allergic rhinitis, unspecified: Secondary | ICD-10-CM

## 2016-06-13 ENCOUNTER — Ambulatory Visit (INDEPENDENT_AMBULATORY_CARE_PROVIDER_SITE_OTHER): Payer: PRIVATE HEALTH INSURANCE | Admitting: *Deleted

## 2016-06-13 DIAGNOSIS — J309 Allergic rhinitis, unspecified: Secondary | ICD-10-CM | POA: Diagnosis not present

## 2016-06-20 ENCOUNTER — Ambulatory Visit (INDEPENDENT_AMBULATORY_CARE_PROVIDER_SITE_OTHER): Payer: PRIVATE HEALTH INSURANCE | Admitting: *Deleted

## 2016-06-20 DIAGNOSIS — J309 Allergic rhinitis, unspecified: Secondary | ICD-10-CM

## 2016-07-03 ENCOUNTER — Ambulatory Visit (INDEPENDENT_AMBULATORY_CARE_PROVIDER_SITE_OTHER): Payer: PRIVATE HEALTH INSURANCE | Admitting: *Deleted

## 2016-07-03 DIAGNOSIS — J309 Allergic rhinitis, unspecified: Secondary | ICD-10-CM

## 2016-07-17 ENCOUNTER — Ambulatory Visit (INDEPENDENT_AMBULATORY_CARE_PROVIDER_SITE_OTHER): Payer: PRIVATE HEALTH INSURANCE | Admitting: *Deleted

## 2016-07-17 DIAGNOSIS — J309 Allergic rhinitis, unspecified: Secondary | ICD-10-CM

## 2016-07-29 DIAGNOSIS — J301 Allergic rhinitis due to pollen: Secondary | ICD-10-CM | POA: Diagnosis not present

## 2016-07-30 DIAGNOSIS — J3089 Other allergic rhinitis: Secondary | ICD-10-CM

## 2016-07-31 ENCOUNTER — Ambulatory Visit (INDEPENDENT_AMBULATORY_CARE_PROVIDER_SITE_OTHER): Payer: PRIVATE HEALTH INSURANCE | Admitting: *Deleted

## 2016-07-31 DIAGNOSIS — J309 Allergic rhinitis, unspecified: Secondary | ICD-10-CM | POA: Diagnosis not present

## 2016-08-18 ENCOUNTER — Ambulatory Visit (INDEPENDENT_AMBULATORY_CARE_PROVIDER_SITE_OTHER): Payer: PRIVATE HEALTH INSURANCE | Admitting: *Deleted

## 2016-08-18 ENCOUNTER — Other Ambulatory Visit: Payer: Self-pay | Admitting: *Deleted

## 2016-08-18 DIAGNOSIS — J309 Allergic rhinitis, unspecified: Secondary | ICD-10-CM

## 2016-09-10 ENCOUNTER — Other Ambulatory Visit: Payer: Self-pay | Admitting: *Deleted

## 2016-09-10 MED ORDER — ALBUTEROL SULFATE 108 (90 BASE) MCG/ACT IN AEPB: 2.0000 | INHALATION_SPRAY | RESPIRATORY_TRACT | 0 refills | Status: DC | PRN

## 2016-09-10 NOTE — Addendum Note (Signed)
Addended by: Berna BueWHITAKER, CARRIE L on: 09/10/2016 04:09 PM   Modules accepted: Orders

## 2016-09-11 ENCOUNTER — Encounter: Payer: Self-pay | Admitting: Allergy and Immunology

## 2016-09-11 ENCOUNTER — Ambulatory Visit (INDEPENDENT_AMBULATORY_CARE_PROVIDER_SITE_OTHER): Payer: PRIVATE HEALTH INSURANCE | Admitting: Allergy and Immunology

## 2016-09-11 VITALS — BP 116/74 | HR 72 | Resp 16

## 2016-09-11 DIAGNOSIS — J309 Allergic rhinitis, unspecified: Secondary | ICD-10-CM

## 2016-09-11 DIAGNOSIS — J329 Chronic sinusitis, unspecified: Secondary | ICD-10-CM | POA: Diagnosis not present

## 2016-09-11 DIAGNOSIS — J4541 Moderate persistent asthma with (acute) exacerbation: Secondary | ICD-10-CM

## 2016-09-11 DIAGNOSIS — H1010 Acute atopic conjunctivitis, unspecified eye: Secondary | ICD-10-CM | POA: Diagnosis not present

## 2016-09-11 MED ORDER — AMOXICILLIN-POT CLAVULANATE 875-125 MG PO TABS: 1.0000 | ORAL_TABLET | Freq: Two times a day (BID) | ORAL | 0 refills | Status: AC

## 2016-09-11 MED ORDER — METHYLPREDNISOLONE ACETATE 80 MG/ML IJ SUSP
80.0000 mg | Freq: Once | INTRAMUSCULAR | Status: AC
  Administered 2016-09-11: 80 mg via INTRAMUSCULAR

## 2016-09-11 NOTE — Progress Notes (Signed)
Follow-up Note  Referring Provider: Philemon KingdomProchnau, Caroline, MD Primary Provider: Philemon KingdomPROCHNAU,CAROLINE, MD Date of Office Visit: 09/11/2016  Subjective:   Barbara Jordan (DOB: 09/27/1970) is a 46 y.o. female [facility] in re-evaluation of the following:  HPI: Barbara Jordan returns to this clinic in reevaluation of her asthma and allergic rhinoconjunctivitis. I've not seen her in this clinic since March 2017.  She was doing wonderful regarding both her upper and lower airways while consistently using some anti-inflammatory medications. However, a few days before Thanksgiving she caught a "cold" from her son and she developed nasal congestion and coughing and just feeling bad in general and she ended up in the urgent care center on the fifth day of symptoms and was treated with amoxicillin and given a nebulizer along with prednisone. She never really improved and she had her antibiotic changed to Biaxin and was given more systemic steroids. She just finished a tapering dose of systemic steroids about 5 days ago. She still continues to have lots of wheezing and coughing and nasal congestion and some yellow green nasal discharge. She has not had any high fever and she's not had any ugly sputum production and she's not had any chest pain. She has been using Flonase and she increased her Arnuity to twice a day when this situation developed. She does use a short acting bronchodilator at least twice a day. Her cough is disturbing her sleep.  She continues on immunotherapy without incident. She does believe that immunotherapy was helping her quite significantly up until this most recent event.  Allergies as of 09/11/2016      Reactions   Hydrocodone    Keflex [cephalexin] Hives   Latex       Medication List      ADRENAL PO Take by mouth.   albuterol (2.5 MG/3ML) 0.083% nebulizer solution Commonly known as:  PROVENTIL Take 2.5 mg by nebulization every 4  (four) hours as needed for wheezing or shortness of breath.   Albuterol Sulfate 108 (90 Base) MCG/ACT Aepb Commonly known as:  PROAIR RESPICLICK Inhale 2 Doses into the lungs every 4 (four) hours as needed. Inhale two puffs every four to six hours as needed for cough or wheeze.   ALLEGRA ALLERGY PO Take 1 tablet by mouth daily.   ARMOUR THYROID 60 MG tablet Generic drug:  thyroid Take 60 mg by mouth daily.   B COMPLEX-B12 PO Take by mouth daily.   COQ10 PO Take by mouth.   EPIPEN 2-PAK IJ Inject as directed as needed.   FISH OIL PO Take by mouth daily.   fluticasone 50 MCG/ACT nasal spray Commonly known as:  FLONASE Place 1 spray into both nostrils daily.   Fluticasone Furoate 200 MCG/ACT Aepb Commonly known as:  ARNUITY ELLIPTA Inhale 1 puff into the lungs daily.   liothyronine 5 MCG tablet Commonly known as:  CYTOMEL   LYSINE PO Take by mouth. C-Lysine   MAGNESIUM CHLORIDE ER PO Take by mouth.   NALTREXONE HCL PO Take by mouth daily.   NASAL SALINE NA Place into the nose as needed.   OVER THE COUNTER MEDICATION Iodoral   PROBIOTIC DAILY PO Take by mouth.   progesterone 100 MG capsule Commonly known as:  PROMETRIUM   SYNTHROID 50 MCG tablet Generic drug:  levothyroxine   TURMERIC PO Take by mouth.   VITAMIN D PO Take by mouth.   WHOLE FOOD MULTIVITAMIN PO Take by mouth.  Past Medical History:  Diagnosis Date  . Allergic rhinoconjunctivitis   . Asthma   . Hypothyroidism     Past Surgical History:  Procedure Laterality Date  . biospy     Fibrocystic breast disease with biopsy  . BREAST REDUCTION SURGERY    . CESAREAN SECTION      Review of systems negative except as noted in HPI / PMHx or noted below:  Review of Systems  Constitutional: Negative.   HENT: Negative.   Eyes: Negative.   Respiratory: Negative.   Cardiovascular: Negative.   Gastrointestinal: Negative.   Genitourinary: Negative.   Musculoskeletal:  Negative.   Skin: Negative.   Neurological: Negative.   Endo/Heme/Allergies: Negative.   Psychiatric/Behavioral: Negative.      Objective:   Vitals:   09/11/16 1053  BP: 116/74  Pulse: 72  Resp: 16          Physical Exam  Constitutional: She is well-developed, well-nourished, and in no distress.  Nasal voice, allergic shiners  HENT:  Head: Normocephalic.  Right Ear: Tympanic membrane, external ear and ear canal normal.  Left Ear: Tympanic membrane, external ear and ear canal normal.  Nose: Mucosal edema present. No rhinorrhea.  Mouth/Throat: Uvula is midline, oropharynx is clear and moist and mucous membranes are normal. No oropharyngeal exudate.  Eyes: Conjunctivae are normal.  Neck: Trachea normal. No tracheal tenderness present. No tracheal deviation present. No thyromegaly present.  Cardiovascular: Normal rate, regular rhythm, S1 normal, S2 normal and normal heart sounds.   No murmur heard. Pulmonary/Chest: Breath sounds normal. No stridor. No respiratory distress. She has no wheezes. She has no rales.  Musculoskeletal: She exhibits no edema.  Lymphadenopathy:       Head (right side): No tonsillar adenopathy present.       Head (left side): No tonsillar adenopathy present.    She has no cervical adenopathy.  Neurological: She is alert. Gait normal.  Skin: No rash noted. She is not diaphoretic. No erythema. Nails show no clubbing.  Psychiatric: Mood and affect normal.    Diagnostics:    Spirometry was performed and demonstrated an FEV1 of 1.96 at 64 % of predicted.  Assessment and Plan:   1. Asthma, not well controlled, moderate persistent, with acute exacerbation   2. Chronic sinusitis, unspecified location   3. Allergic rhinoconjunctivitis     1. Depomedrol 80 IM delivered in clinic today  2. Augmentin 875 one tablet two times a day for 20 days  3. Nasal saline multiple times per day  4. Claritin or Allergra one time per day  5. Mucinex DM two  tablets two times per day  6. Change Arnuity to [facility] 200 two inhalations twice a day with spacer for the next month. Can return to Arnuity 200 one time per day after one month.  7. Continue flonase one spray each nostril twice a day  8. Proair Respiclick or albuterol nebulization if needed  9. Further treatment?  10. Continue immunotherapy and Epi-pen  11. Return to clinic in 6 months or earlier if problem  I think that [name] should do better after she uses therapy directed against a prolonged infection of her respiratory tract as well as addressing the inflammation of her respiratory tract as noted above. She will keep in contact with me noting her response as she moves forward. If she does well I will see her back in this clinic in 6 months or earlier if there is a problem.  Laurette Schimke, MD [facility] 

## 2016-09-11 NOTE — Patient Instructions (Addendum)
  1. Depomedrol 80 IM delivered in clinic today  2. Augmentin 875 one tablet two times a day for 20 days  3. Nasal saline multiple times per day  4. Claritin or Allergra one time per day  5. Mucinex DM two tablets two times per day  6. Change [facility]nuity to [facility] 200 two inhalations twice a day with spacer for the next month. Can return to Arnuity 200 one time per day after one month.  7. Continue flonase one spray each nostril twice a day  8. Proair Respiclick or albuterol nebulization if needed  9. Further treatment?  10. Continue immunotherapy and Epi-pen  11. Return to clinic in 6 months or earlier if problem

## 2016-09-26 ENCOUNTER — Ambulatory Visit (INDEPENDENT_AMBULATORY_CARE_PROVIDER_SITE_OTHER): Payer: PRIVATE HEALTH INSURANCE | Admitting: *Deleted

## 2016-09-26 DIAGNOSIS — J309 Allergic rhinitis, unspecified: Secondary | ICD-10-CM | POA: Diagnosis not present

## 2016-10-09 ENCOUNTER — Ambulatory Visit (INDEPENDENT_AMBULATORY_CARE_PROVIDER_SITE_OTHER): Payer: PRIVATE HEALTH INSURANCE | Admitting: *Deleted

## 2016-10-09 DIAGNOSIS — J309 Allergic rhinitis, unspecified: Secondary | ICD-10-CM

## 2016-10-23 ENCOUNTER — Ambulatory Visit (INDEPENDENT_AMBULATORY_CARE_PROVIDER_SITE_OTHER): Payer: PRIVATE HEALTH INSURANCE | Admitting: *Deleted

## 2016-10-23 DIAGNOSIS — J309 Allergic rhinitis, unspecified: Secondary | ICD-10-CM | POA: Diagnosis not present

## 2016-11-06 ENCOUNTER — Ambulatory Visit (INDEPENDENT_AMBULATORY_CARE_PROVIDER_SITE_OTHER): Payer: PRIVATE HEALTH INSURANCE | Admitting: *Deleted

## 2016-11-06 DIAGNOSIS — J309 Allergic rhinitis, unspecified: Secondary | ICD-10-CM | POA: Diagnosis not present

## 2016-11-27 ENCOUNTER — Ambulatory Visit (INDEPENDENT_AMBULATORY_CARE_PROVIDER_SITE_OTHER): Payer: PRIVATE HEALTH INSURANCE | Admitting: *Deleted

## 2016-11-27 DIAGNOSIS — J309 Allergic rhinitis, unspecified: Secondary | ICD-10-CM | POA: Diagnosis not present

## 2016-12-11 ENCOUNTER — Ambulatory Visit (INDEPENDENT_AMBULATORY_CARE_PROVIDER_SITE_OTHER): Payer: PRIVATE HEALTH INSURANCE | Admitting: *Deleted

## 2016-12-11 DIAGNOSIS — J309 Allergic rhinitis, unspecified: Secondary | ICD-10-CM

## 2016-12-22 ENCOUNTER — Other Ambulatory Visit: Payer: Self-pay | Admitting: Allergy and Immunology

## 2016-12-25 ENCOUNTER — Ambulatory Visit (INDEPENDENT_AMBULATORY_CARE_PROVIDER_SITE_OTHER): Payer: PRIVATE HEALTH INSURANCE | Admitting: *Deleted

## 2016-12-25 DIAGNOSIS — J309 Allergic rhinitis, unspecified: Secondary | ICD-10-CM

## 2017-01-08 ENCOUNTER — Ambulatory Visit (INDEPENDENT_AMBULATORY_CARE_PROVIDER_SITE_OTHER): Payer: PRIVATE HEALTH INSURANCE | Admitting: *Deleted

## 2017-01-08 DIAGNOSIS — J309 Allergic rhinitis, unspecified: Secondary | ICD-10-CM | POA: Diagnosis not present

## 2017-01-12 ENCOUNTER — Ambulatory Visit: Payer: PRIVATE HEALTH INSURANCE | Admitting: Allergy and Immunology

## 2017-02-02 ENCOUNTER — Ambulatory Visit (INDEPENDENT_AMBULATORY_CARE_PROVIDER_SITE_OTHER): Payer: PRIVATE HEALTH INSURANCE | Admitting: *Deleted

## 2017-02-02 DIAGNOSIS — J309 Allergic rhinitis, unspecified: Secondary | ICD-10-CM

## 2017-02-26 ENCOUNTER — Ambulatory Visit (INDEPENDENT_AMBULATORY_CARE_PROVIDER_SITE_OTHER): Payer: PRIVATE HEALTH INSURANCE | Admitting: *Deleted

## 2017-02-26 DIAGNOSIS — J309 Allergic rhinitis, unspecified: Secondary | ICD-10-CM | POA: Diagnosis not present

## 2017-03-11 NOTE — Progress Notes (Signed)
VIALS EXP 03-11-18

## 2017-03-12 DIAGNOSIS — J3089 Other allergic rhinitis: Secondary | ICD-10-CM | POA: Diagnosis not present

## 2017-03-13 ENCOUNTER — Ambulatory Visit (INDEPENDENT_AMBULATORY_CARE_PROVIDER_SITE_OTHER): Payer: Commercial Managed Care - PPO

## 2017-03-13 DIAGNOSIS — J309 Allergic rhinitis, unspecified: Secondary | ICD-10-CM

## 2017-03-16 ENCOUNTER — Telehealth: Payer: Self-pay | Admitting: Allergy and Immunology

## 2017-03-16 MED ORDER — FLUTICASONE FUROATE 200 MCG/ACT IN AEPB: 1.0000 | INHALATION_SPRAY | Freq: Every day | RESPIRATORY_TRACT | 0 refills | Status: DC

## 2017-03-16 NOTE — Telephone Encounter (Signed)
Olegario MessierKathy called in and stated she had changed pharmacies and would like her medication sent to [facility] [address]   Her other prescriptions transferred over except ARNUITY ELLIPTA.  She would like that prescription [facility] please and if possible she would like the generic due to cost.  She was last seen in January 2018.

## 2017-03-16 NOTE — Telephone Encounter (Signed)
One refill sent to [facility] for patient advising same and needs appt and can go online or come by for coupon since no generic available

## 2017-03-19 ENCOUNTER — Other Ambulatory Visit: Payer: Self-pay | Admitting: Obstetrics and Gynecology

## 2017-03-19 DIAGNOSIS — Z1231 Encounter for screening mammogram for malignant neoplasm of breast: Secondary | ICD-10-CM

## 2017-04-02 ENCOUNTER — Ambulatory Visit (INDEPENDENT_AMBULATORY_CARE_PROVIDER_SITE_OTHER): Payer: Commercial Managed Care - PPO | Admitting: *Deleted

## 2017-04-02 DIAGNOSIS — J309 Allergic rhinitis, unspecified: Secondary | ICD-10-CM

## 2017-04-23 ENCOUNTER — Ambulatory Visit (INDEPENDENT_AMBULATORY_CARE_PROVIDER_SITE_OTHER): Payer: Commercial Managed Care - PPO | Admitting: *Deleted

## 2017-04-23 DIAGNOSIS — J309 Allergic rhinitis, unspecified: Secondary | ICD-10-CM

## 2017-05-06 ENCOUNTER — Other Ambulatory Visit: Payer: Self-pay | Admitting: Allergy and Immunology

## 2017-05-11 ENCOUNTER — Ambulatory Visit
Admission: RE | Admit: 2017-05-11 | Discharge: 2017-05-11 | Disposition: A | Discharge status: Home / Self Care | Payer: Commercial Managed Care - PPO | Source: Ambulatory Visit | Attending: Obstetrics and Gynecology | Admitting: Obstetrics and Gynecology

## 2017-05-11 ENCOUNTER — Encounter: Payer: Self-pay | Admitting: Allergy and Immunology

## 2017-05-11 ENCOUNTER — Ambulatory Visit (INDEPENDENT_AMBULATORY_CARE_PROVIDER_SITE_OTHER): Payer: Commercial Managed Care - PPO | Admitting: Allergy and Immunology

## 2017-05-11 VITALS — BP 118/70 | HR 72 | Resp 16

## 2017-05-11 DIAGNOSIS — Z1231 Encounter for screening mammogram for malignant neoplasm of breast: Secondary | ICD-10-CM

## 2017-05-11 DIAGNOSIS — J309 Allergic rhinitis, unspecified: Secondary | ICD-10-CM

## 2017-05-11 DIAGNOSIS — J4530 Mild persistent asthma, uncomplicated: Secondary | ICD-10-CM | POA: Diagnosis not present

## 2017-05-11 MED ORDER — MOMETASONE FUROATE 220 MCG/INH IN AEPB: INHALATION_SPRAY | RESPIRATORY_TRACT | 5 refills | Status: DC

## 2017-05-11 NOTE — Patient Instructions (Addendum)
  1. Continue Claritin or Allergra one time per day  2. Asmanex 220 twisthaler one inhalation 2 time per day  3. Continue flonase one spray each nostril twice a day  4. Proair Respiclick or albuterol nebulization if needed  5. Continue immunotherapy and Epi-pen  6. Return to clinic in 6 months or earlier if problem  7. Obtain fall flu vaccine

## 2017-05-11 NOTE — Progress Notes (Signed)
Follow-up Note  Referring Provider: Philemon Kingdom, MD Primary Provider: Philemon Kingdom, MD Date of Office Visit: 05/11/2017  Subjective:   Barbara Jordan (DOB: September 15, 1970) is a 46 y.o. female who [facility]/2017 in re-evaluation of the following:  HPI: Barbara Jordan returns to this clinic in reevaluation of her asthma and allergic rhinoconjunctivitis. Her last visit to this clinic was January 2018.  She has really done quite well with her asthma and has not required a systemic steroid to treat an exacerbation and she can exercise without much difficulty. However, she has been out of her inhaled steroid for the past week and this weekend and she went to a cookout located [place] barn and she developed a little bit of coughing since that exposure.  Her nose has really been doing quite well. She has not required an antibiotic to treat an episode of sinusitis.  Her immunotherapy is going quite well at this point in time.  Allergies as of 05/11/2017      Reactions   Hydrocodone    Keflex [cephalexin] Hives   Latex       Medication List      ADRENAL PO Take by mouth.   albuterol (2.5 MG/3ML) 0.083% nebulizer solution Commonly known as:  PROVENTIL Take 2.5 mg by nebulization every 4 (four) hours as needed for wheezing or shortness of breath.   Albuterol Sulfate 108 (90 Base) MCG/ACT Aepb Commonly known as:  PROAIR RESPICLICK Inhale 2 Doses into the lungs every 4 (four) hours as needed. Inhale two puffs every four to six hours as needed for cough or wheeze.   ARMOUR THYROID 60 MG tablet Generic drug:  thyroid Take 60 mg by mouth daily.   B COMPLEX-B12 PO Take by mouth daily.   CLARITIN 10 MG tablet Generic drug:  loratadine Take 10 mg by mouth daily.   COLLAGEN PO Take by mouth. Collagen peptide powder.   COQ10 PO Take by mouth.   EPIPEN 2-PAK IJ Inject as directed as needed.   fluticasone 50 MCG/ACT nasal  spray Commonly known as:  FLONASE Place 1 spray into both nostrils daily.   Fluticasone Furoate 200 MCG/ACT Aepb Commonly known as:  ARNUITY ELLIPTA Inhale 1 puff into the lungs daily.   KRILL OIL PO Take by mouth.   liothyronine 5 MCG tablet Commonly known as:  CYTOMEL   LYSINE PO Take by mouth. C-Lysine   MAGNESIUM CHLORIDE ER PO Take by mouth.   mometasone 220 MCG/INH inhaler Commonly known as:  ASMANEX 60 METERED DOSES Inhale one puff twice daily to prevent cough or wheeze. Rinse mouth after use.   NALTREXONE HCL PO Take by mouth daily.   NASAL SALINE NA Place into the nose as needed.   OVER THE COUNTER MEDICATION Iodoral   PROBIOTIC DAILY PO Take by mouth.   progesterone 100 MG capsule Commonly known as:  PROMETRIUM   SYNTHROID 50 MCG tablet Generic drug:  levothyroxine   TURMERIC PO Take by mouth.   VITAMIN D PO Take by mouth.   WHOLE FOOD MULTIVITAMIN PO Take by mouth.      Past Medical History:  Diagnosis Date  . Allergic rhinoconjunctivitis   . Asthma   . Hypothyroidism     Past Surgical History:  Procedure Laterality Date  . biospy     Fibrocystic breast disease with biopsy  . BREAST EXCISIONAL BIOPSY Left   . BREAST REDUCTION SURGERY    . CESAREAN SECTION    .  REDUCTION MAMMAPLASTY Bilateral    about 8 years ago    Review of systems negative except as noted in HPI / PMHx or noted below:  Review of Systems  Constitutional: Negative.   HENT: Negative.   Eyes: Negative.   Respiratory: Negative.   Cardiovascular: Negative.   Gastrointestinal: Negative.   Genitourinary: Negative.   Musculoskeletal: Negative.   Skin: Negative.   Neurological: Negative.   Endo/Heme/Allergies: Negative.   Psychiatric/Behavioral: Negative.      Objective:   Vitals:   05/11/17 1138  BP: 118/70  Pulse: 72  Resp: 16          Physical Exam  Constitutional: She is well-developed, well-nourished, and in no distress.  HENT:  Head:  Normocephalic.  Right Ear: Tympanic membrane, external ear and ear canal normal.  Left Ear: Tympanic membrane, external ear and ear canal normal.  Nose: Nose normal. No mucosal edema or rhinorrhea.  Mouth/Throat: Uvula is midline, oropharynx is clear and moist and mucous membranes are normal. No oropharyngeal exudate.  Eyes: Conjunctivae are normal.  Neck: Trachea normal. No tracheal tenderness present. No tracheal deviation present. No thyromegaly present.  Cardiovascular: Normal rate, regular rhythm, S1 normal, S2 normal and normal heart sounds.   No murmur heard. Pulmonary/Chest: Breath sounds normal. No stridor. No respiratory distress. She has no wheezes. She has no rales.  Musculoskeletal: She exhibits no edema.  Lymphadenopathy:       Head (right side): No tonsillar adenopathy present.       Head (left side): No tonsillar adenopathy present.    She has no cervical adenopathy.  Neurological: She is alert. Gait normal.  Skin: No rash noted. She is not diaphoretic. No erythema. Nails show no clubbing.  Psychiatric: Mood and affect normal.    Diagnostics:    Spirometry was performed and demonstrated an FEV1 of 2.02 at 66 % of predicted.  The patient had an Asthma Control Test with the following results: ACT Total Score: 23.    Assessment and Plan:   1. Asthma, well controlled, mild persistent   2. Allergic rhinitis, unspecified seasonality, unspecified trigger     1. Continue Claritin or Allergra one time per day  2. Asmanex 220 twisthaler one inhalation 2 time per day  3. Continue flonase one spray each nostril twice a day  4. Proair Respiclick or albuterol nebulization if needed  5. Continue immunotherapy and Epi-pen  6. Return to clinic in 6 months or earlier if problem  7. Obtain fall flu vaccine  Barbara Jordan appears to be doing very well other than the fact that she has had a very slight exacerbation this weekend most likely secondary to the fact that she is not  using her inhaled steroid for a week and she did have exposure to a barn. We will restart her back on an inhaled steroid as noted above. Because of an insurance issue she will change from Arnuity to Asmanex. I will see her back in this clinic in 6 months or earlier if there is a problem.  Laurette SchimkeEric Kozlow, MD Allergy / Immunology [facility]

## 2017-06-08 ENCOUNTER — Ambulatory Visit (INDEPENDENT_AMBULATORY_CARE_PROVIDER_SITE_OTHER): Payer: Commercial Managed Care - PPO | Admitting: *Deleted

## 2017-06-08 DIAGNOSIS — J309 Allergic rhinitis, unspecified: Secondary | ICD-10-CM | POA: Diagnosis not present

## 2017-06-25 ENCOUNTER — Ambulatory Visit (INDEPENDENT_AMBULATORY_CARE_PROVIDER_SITE_OTHER): Payer: Commercial Managed Care - PPO | Admitting: *Deleted

## 2017-06-25 DIAGNOSIS — J309 Allergic rhinitis, unspecified: Secondary | ICD-10-CM

## 2017-07-09 ENCOUNTER — Ambulatory Visit (INDEPENDENT_AMBULATORY_CARE_PROVIDER_SITE_OTHER): Payer: Commercial Managed Care - PPO | Admitting: *Deleted

## 2017-07-09 DIAGNOSIS — J309 Allergic rhinitis, unspecified: Secondary | ICD-10-CM

## 2017-07-29 DIAGNOSIS — J301 Allergic rhinitis due to pollen: Secondary | ICD-10-CM | POA: Diagnosis not present

## 2017-07-29 NOTE — Progress Notes (Signed)
VIALS EXP 07/29/18 

## 2017-07-30 ENCOUNTER — Ambulatory Visit (INDEPENDENT_AMBULATORY_CARE_PROVIDER_SITE_OTHER): Payer: Commercial Managed Care - PPO | Admitting: *Deleted

## 2017-07-30 DIAGNOSIS — J309 Allergic rhinitis, unspecified: Secondary | ICD-10-CM | POA: Diagnosis not present

## 2017-08-17 ENCOUNTER — Ambulatory Visit (INDEPENDENT_AMBULATORY_CARE_PROVIDER_SITE_OTHER): Payer: Commercial Managed Care - PPO | Admitting: *Deleted

## 2017-08-17 DIAGNOSIS — J309 Allergic rhinitis, unspecified: Secondary | ICD-10-CM | POA: Diagnosis not present

## 2017-09-03 ENCOUNTER — Ambulatory Visit (INDEPENDENT_AMBULATORY_CARE_PROVIDER_SITE_OTHER): Payer: Commercial Managed Care - PPO | Admitting: *Deleted

## 2017-09-03 DIAGNOSIS — J309 Allergic rhinitis, unspecified: Secondary | ICD-10-CM

## 2017-09-25 ENCOUNTER — Ambulatory Visit (INDEPENDENT_AMBULATORY_CARE_PROVIDER_SITE_OTHER): Payer: Commercial Managed Care - PPO

## 2017-09-25 DIAGNOSIS — J309 Allergic rhinitis, unspecified: Secondary | ICD-10-CM | POA: Diagnosis not present

## 2017-10-15 ENCOUNTER — Ambulatory Visit (INDEPENDENT_AMBULATORY_CARE_PROVIDER_SITE_OTHER): Payer: Commercial Managed Care - PPO | Admitting: *Deleted

## 2017-10-15 DIAGNOSIS — J309 Allergic rhinitis, unspecified: Secondary | ICD-10-CM | POA: Diagnosis not present

## 2017-11-02 ENCOUNTER — Ambulatory Visit (INDEPENDENT_AMBULATORY_CARE_PROVIDER_SITE_OTHER): Payer: Commercial Managed Care - PPO | Admitting: *Deleted

## 2017-11-02 DIAGNOSIS — J309 Allergic rhinitis, unspecified: Secondary | ICD-10-CM | POA: Diagnosis not present

## 2017-11-20 ENCOUNTER — Ambulatory Visit (INDEPENDENT_AMBULATORY_CARE_PROVIDER_SITE_OTHER): Payer: Commercial Managed Care - PPO | Admitting: *Deleted

## 2017-11-20 DIAGNOSIS — J309 Allergic rhinitis, unspecified: Secondary | ICD-10-CM

## 2017-12-11 ENCOUNTER — Ambulatory Visit (INDEPENDENT_AMBULATORY_CARE_PROVIDER_SITE_OTHER): Payer: Commercial Managed Care - PPO | Admitting: *Deleted

## 2017-12-11 DIAGNOSIS — J309 Allergic rhinitis, unspecified: Secondary | ICD-10-CM | POA: Diagnosis not present

## 2017-12-31 ENCOUNTER — Ambulatory Visit (INDEPENDENT_AMBULATORY_CARE_PROVIDER_SITE_OTHER): Payer: Commercial Managed Care - PPO | Admitting: *Deleted

## 2017-12-31 DIAGNOSIS — J309 Allergic rhinitis, unspecified: Secondary | ICD-10-CM | POA: Diagnosis not present

## 2018-01-29 ENCOUNTER — Other Ambulatory Visit: Payer: Self-pay | Admitting: *Deleted

## 2018-01-29 MED ORDER — ALBUTEROL SULFATE 108 (90 BASE) MCG/ACT IN AEPB: 2.0000 | INHALATION_SPRAY | RESPIRATORY_TRACT | 0 refills | Status: DC | PRN

## 2018-02-01 ENCOUNTER — Ambulatory Visit (INDEPENDENT_AMBULATORY_CARE_PROVIDER_SITE_OTHER): Payer: Commercial Managed Care - PPO | Admitting: Allergy and Immunology

## 2018-02-01 ENCOUNTER — Encounter: Payer: Self-pay | Admitting: Allergy and Immunology

## 2018-02-01 VITALS — BP 116/68 | HR 60 | Resp 16

## 2018-02-01 DIAGNOSIS — J4530 Mild persistent asthma, uncomplicated: Secondary | ICD-10-CM | POA: Diagnosis not present

## 2018-02-01 DIAGNOSIS — J3089 Other allergic rhinitis: Secondary | ICD-10-CM

## 2018-02-01 NOTE — Progress Notes (Signed)
Follow-up Note  Referring Provider: Philemon Kingdom, MD Primary Provider: Philemon Kingdom, MD Date of Office Visit: 02/01/2018  Subjective:   Barbara Jordan (DOB: 08-31-1971) is a 47 y.o. female who [facility] on 02/01/2018 in re-evaluation of the following:  HPI: Barbara Jordan returns to this clinic in reevaluation of asthma and allergic rhinoconjunctivitis.  Her last visit to this clinic was 11 May 2017.  She has been undergoing a course of immunotherapy presently at every 3 weeks without any adverse effect and has really done well over the course of the past 10 months without a significant respiratory event that has required the administration of an antibiotic or systemic steroid.  However, 1 week ago, after completing a cruise to the [place], she developed a fever of almost 102 associated with wheezing and coughing and head congestion for which she went to the urgent care center within 24 hours of disease onset and was treated with Augmentin and prednisone which she is finishing up.  Now she feels much better.  She still has some wheezing but she has not had to use her bronchodilator today and she does not have any anosmia or ugly nasal discharge or headaches or other systemic or constitutional symptoms.  Her use of Asmanex and Flonase usually is 1 time per day but she did increase these medications to twice a day when she became ill.  Allergies as of 02/01/2018      Reactions   Hydrocodone    Latex       Medication List      ADRENAL PO Take by mouth.   albuterol (2.5 MG/3ML) 0.083% nebulizer solution Commonly known as:  PROVENTIL Take 2.5 mg by nebulization every 4 (four) hours as needed for wheezing or shortness of breath.   Albuterol Sulfate 108 (90 Base) MCG/ACT Aepb Commonly known as:  PROAIR RESPICLICK Inhale 2 Doses into the lungs every 4 (four) hours as needed. Inhale two puffs every four to six hours as needed for cough or  wheeze.   amoxicillin-clavulanate 875-125 MG tablet Commonly known as:  AUGMENTIN Take 1 tablet by mouth 2 (two) times daily. for 10 days   ARMOUR THYROID 60 MG tablet Generic drug:  thyroid Take 60 mg by mouth daily.   B COMPLEX-B12 PO Take by mouth daily.   COQ10 PO Take by mouth.   DHEA PO Take by mouth daily.   EPIPEN 2-PAK IJ Inject as directed as needed.   fluticasone 50 MCG/ACT nasal spray Commonly known as:  FLONASE Place 1 spray into both nostrils daily.   KRILL OIL PO Take by mouth.   liothyronine 5 MCG tablet Commonly known as:  CYTOMEL   LYSINE PO Take by mouth. C-Lysine   MAGNESIUM CHLORIDE ER PO Take by mouth.   methylPREDNISolone 4 MG tablet Commonly known as:  MEDROL TAKE 6 TABLETS ON DAY 1 AS DIRECTED AND DECREASE BY 1 TAB EACH DAY FOR A TOTAL OF 6 DAYS   mometasone 220 MCG/INH inhaler Commonly known as:  ASMANEX 60 METERED DOSES Inhale one puff twice daily to prevent cough or wheeze. Rinse mouth after use.   NALTREXONE HCL PO Take by mouth daily.   NASAL SALINE NA Place into the nose as needed.   PRESCRIPTION MEDICATION Testosterone   PROBIOTIC DAILY PO Take by mouth.   progesterone 100 MG capsule Commonly known as:  PROMETRIUM   SYNTHROID 50 MCG tablet Generic drug:  levothyroxine   TURMERIC PO Take by  mouth.   VITAMIN D PO Take by mouth.   WHOLE FOOD MULTIVITAMIN PO Take by mouth.       Past Medical History:  Diagnosis Date  . Allergic rhinoconjunctivitis   . Asthma   . Hypothyroidism     Past Surgical History:  Procedure Laterality Date  . biospy     Fibrocystic breast disease with biopsy  . BREAST EXCISIONAL BIOPSY Left   . BREAST REDUCTION SURGERY    . CESAREAN SECTION    . REDUCTION MAMMAPLASTY Bilateral    about 8 years ago    Review of systems negative except as noted in HPI / PMHx or noted below:  Review of Systems  Constitutional: Negative.   HENT: Negative.   Eyes: Negative.   Respiratory:  Negative.   Cardiovascular: Negative.   Gastrointestinal: Negative.   Genitourinary: Negative.   Musculoskeletal: Negative.   Skin: Negative.   Neurological: Negative.   Endo/Heme/Allergies: Negative.   Psychiatric/Behavioral: Negative.      Objective:   Vitals:   02/01/18 1651  BP: 116/68  Pulse: 60  Resp: 16          Physical Exam  HENT:  Head: Normocephalic.  Right Ear: Tympanic membrane, external ear and ear canal normal.  Left Ear: Tympanic membrane, external ear and ear canal normal.  Nose: Nose normal. No mucosal edema or rhinorrhea.  Mouth/Throat: Uvula is midline, oropharynx is clear and moist and mucous membranes are normal. No oropharyngeal exudate.  Eyes: Conjunctivae are normal.  Neck: Trachea normal. No tracheal tenderness present. No tracheal deviation present. No thyromegaly present.  Cardiovascular: Normal rate, regular rhythm, S1 normal, S2 normal and normal heart sounds.  No murmur heard. Pulmonary/Chest: Breath sounds normal. No stridor. No respiratory distress. She has no wheezes. She has no rales.  Musculoskeletal: She exhibits no edema.  Lymphadenopathy:       Head (right side): No tonsillar adenopathy present.       Head (left side): No tonsillar adenopathy present.    She has no cervical adenopathy.  Neurological: She is alert.  Skin: No rash noted. She is not diaphoretic. No erythema. Nails show no clubbing.    Diagnostics:    Spirometry was performed and demonstrated an FEV1 of 2.07 at 68 % of predicted.  The patient had an Asthma Control Test with the following results: ACT Total Score: 17.    Assessment and Plan:   1. Asthma, well controlled, mild persistent   2. Other allergic rhinitis     1. Continue Asmanex 220 twisthaler one inhalation 1 time per day. Can increase to 2 inhalations 2 times a day during 'flare up'  2. Continue flonase one spray each nostril 1-2 times a day  3. Continue Proair Respiclick or albuterol  nebulization and antihistamine and Mucinex DM if needed  4. Continue immunotherapy and Epi-pen  5. Return to clinic in 12 months or earlier if problem  Overall Barbara Jordan has done very well and I do not see a need for changing her medical therapy at this point and she will continue to use low-dose anti-inflammatory agents for her respiratory tract and continue on immunotherapy.  Her recent infection associated with a febrile event should resolve over the course of the next week and she will contact me if that issue does not resolve during this timeframe.  Otherwise, I will see her back in this clinic in 1 year or earlier if there is a problem.  Barbara SchimkeEric Sanjit Mcmichael, MD Allergy / Immunology [facility]

## 2018-02-01 NOTE — Patient Instructions (Signed)
  1. Continue Asmanex 220 twisthaler one inhalation 1 time per day. Can increase to 2 inhalations 2 times a day during 'flare up'  2. Continue flonase one spray each nostril 1-2 times a day  3. Continue Proair Respiclick or albuterol nebulization and antihistamine and Mucinex DM if needed  4. Continue immunotherapy and Epi-pen  5. Return to clinic in 12 months or earlier if problem

## 2018-02-02 ENCOUNTER — Encounter: Payer: Self-pay | Admitting: Allergy and Immunology

## 2018-02-08 ENCOUNTER — Ambulatory Visit (INDEPENDENT_AMBULATORY_CARE_PROVIDER_SITE_OTHER): Payer: Commercial Managed Care - PPO | Admitting: *Deleted

## 2018-02-08 DIAGNOSIS — J309 Allergic rhinitis, unspecified: Secondary | ICD-10-CM | POA: Diagnosis not present

## 2018-02-11 ENCOUNTER — Ambulatory Visit (INDEPENDENT_AMBULATORY_CARE_PROVIDER_SITE_OTHER): Payer: Commercial Managed Care - PPO | Admitting: Allergy and Immunology

## 2018-02-11 ENCOUNTER — Encounter: Payer: Self-pay | Admitting: Allergy and Immunology

## 2018-02-11 VITALS — BP 108/60 | HR 58 | Resp 16

## 2018-02-11 DIAGNOSIS — J4541 Moderate persistent asthma with (acute) exacerbation: Secondary | ICD-10-CM | POA: Diagnosis not present

## 2018-02-11 DIAGNOSIS — J3089 Other allergic rhinitis: Secondary | ICD-10-CM

## 2018-02-11 MED ORDER — METHYLPREDNISOLONE ACETATE 80 MG/ML IJ SUSP
80.0000 mg | Freq: Once | INTRAMUSCULAR | Status: AC
  Administered 2018-02-11: 80 mg via INTRAMUSCULAR

## 2018-02-11 MED ORDER — AZITHROMYCIN 500 MG PO TABS: 500.0000 mg | ORAL_TABLET | Freq: Every day | ORAL | 0 refills | Status: AC

## 2018-02-11 NOTE — Patient Instructions (Addendum)
  1. Continue Asmanex 220 twisthaler one inhalation 1 time per day. Can increase to 2 inhalations 2 times a day during 'flare up'  2. Continue flonase one spray each nostril 1-2 times a day  3. Continue Proair Respiclick or albuterol nebulization and antihistamine and Mucinex DM if needed  4. Continue immunotherapy and Epi-pen  5. For this recent event use the following:   A.  DepoMedrol 80 IM delivered in clinic today  B.  Azithromycin 500 mg once a day x 3 days  C.  Start Symbicort 160 - 2 inhalations twice a day w/ spacer x 2 weeks  D.  Continue Asmanex 220 - 2 inhalations twice a day  6. Return to clinic in 12 months or earlier if problem

## 2018-02-11 NOTE — Progress Notes (Signed)
Follow-up Note  Referring Provider: Philemon KingdomProchnau, Caroline, MD Primary Provider: Philemon KingdomProchnau, Caroline, MD Date of Office Visit: 02/11/2018  Subjective:   Barbara DonathKatherine Jordan (DOB: 12/16/1970) is a 47 y.o. female [facility] in re-evaluation of the following:  HPI: Barbara MessierKathy returns to this clinic in evaluation of her asthma and allergic rhinoconjunctivitis.  Her last visit to this clinic was 01 February 2018.  At that point in time she was completing a course of Augmentin and a systemic steroid for what appeared to be a significant infectious indeed disease induced flare of her respiratory tract disease that she contracted while on a cruise..  She did well for a period in time but over the course of the past 4 to 5 days she has had lots of wheezing and coughing and is using her bronchodilator multiple times per day.  She has not had any associated chest pain or sputum production or upper airway symptoms or reflux symptoms or fever.  She continues on immunotherapy without any adverse effect.  Allergies as of 02/11/2018      Reactions   Hydrocodone    Latex       Medication List      ADRENAL PO Take by mouth.   albuterol (2.5 MG/3ML) 0.083% nebulizer solution Commonly known as:  PROVENTIL Take 2.5 mg by nebulization every 4 (four) hours as needed for wheezing or shortness of breath.   Albuterol Sulfate 108 (90 Base) MCG/ACT Aepb Commonly known as:  PROAIR RESPICLICK Inhale 2 Doses into the lungs every 4 (four) hours as needed. Inhale two puffs every four to six hours as needed for cough or wheeze.   ARMOUR THYROID 60 MG tablet Generic drug:  thyroid Take 60 mg by mouth daily.   azithromycin 500 MG tablet Commonly known as:  ZITHROMAX Take 1 tablet (500 mg total) by mouth daily for 3 days.   B COMPLEX-B12 PO Take by mouth daily.   COQ10 PO Take by mouth.   DHEA PO Take by mouth daily.   EPIPEN 2-PAK IJ Inject as directed as  needed.   fluticasone 50 MCG/ACT nasal spray Commonly known as:  FLONASE Place 1 spray into both nostrils daily.   KRILL OIL PO Take by mouth.   liothyronine 5 MCG tablet Commonly known as:  CYTOMEL   LYSINE PO Take by mouth. C-Lysine   MAGNESIUM CHLORIDE ER PO Take by mouth.   mometasone 220 MCG/INH inhaler Commonly known as:  ASMANEX 60 METERED DOSES Inhale one puff twice daily to prevent cough or wheeze. Rinse mouth after use.   NALTREXONE HCL PO Take by mouth daily.   NASAL SALINE NA Place into the nose as needed.   PRESCRIPTION MEDICATION Testosterone   PROBIOTIC DAILY PO Take by mouth.   progesterone 100 MG capsule Commonly known as:  PROMETRIUM   SYNTHROID 50 MCG tablet Generic drug:  levothyroxine   TURMERIC PO Take by mouth.   VITAMIN D PO Take by mouth.   WHOLE FOOD MULTIVITAMIN PO Take by mouth.       Past Medical History:  Diagnosis Date  . Allergic rhinoconjunctivitis   . Asthma   . Hypothyroidism     Past Surgical History:  Procedure Laterality Date  . biospy     Fibrocystic breast disease with biopsy  . BREAST EXCISIONAL BIOPSY Left   . BREAST REDUCTION SURGERY    . CESAREAN SECTION    . REDUCTION MAMMAPLASTY Bilateral  about 8 years ago    Review of systems negative except as noted in HPI / PMHx or noted below:  Review of Systems  Constitutional: Negative.   HENT: Negative.   Eyes: Negative.   Respiratory: Negative.   Cardiovascular: Negative.   Gastrointestinal: Negative.   Genitourinary: Negative.   Musculoskeletal: Negative.   Skin: Negative.   Neurological: Negative.   Endo/Heme/Allergies: Negative.   Psychiatric/Behavioral: Negative.      Objective:   Vitals:   02/11/18 1047  BP: 108/60  Pulse: (!) 58  Resp: 16          Physical Exam  HENT:  Head: Normocephalic.  Right Ear: Tympanic membrane, external ear and ear canal normal.  Left Ear: Tympanic membrane, external ear and ear canal normal.   Nose: Nose normal. No mucosal edema or rhinorrhea.  Mouth/Throat: Uvula is midline, oropharynx is clear and moist and mucous membranes are normal. No oropharyngeal exudate.  Eyes: Conjunctivae are normal.  Neck: Trachea normal. No tracheal tenderness present. No tracheal deviation present. No thyromegaly present.  Cardiovascular: Normal rate, regular rhythm, S1 normal, S2 normal and normal heart sounds.  No murmur heard. Pulmonary/Chest: Breath sounds normal. No stridor. No respiratory distress. She has no wheezes. She has no rales.  Musculoskeletal: She exhibits no edema.  Lymphadenopathy:       Head (right side): No tonsillar adenopathy present.       Head (left side): No tonsillar adenopathy present.    She has no cervical adenopathy.  Neurological: She is alert.  Skin: No rash noted. She is not diaphoretic. No erythema. Nails show no clubbing.    Diagnostics:    Spirometry was performed and demonstrated an FEV1 of 1.97 at 66 % of predicted.  The patient had an Asthma Control Test with the following results: ACT Total Score: 10.    Assessment and Plan:   1. Asthma, not well controlled, moderate persistent, with acute exacerbation   2. Other allergic rhinitis     1. Continue Asmanex 220 twisthaler one inhalation 1 time per day. Can increase to 2 inhalations 2 times a day during 'flare up'  2. Continue flonase one spray each nostril 1-2 times a day  3. Continue Proair Respiclick or albuterol nebulization and antihistamine and Mucinex DM if needed  4. Continue immunotherapy and Epi-pen  5. For this recent event use the following:   A.  DepoMedrol 80 IM delivered in clinic today  B.  Azithromycin 500 mg once a day x 3 days  C.  Start Symbicort 160 - 2 inhalations twice a day w/ spacer x 2 weeks  D.  Continue Asmanex 220 - 2 inhalations twice a day  6. Return to clinic in 12 months or earlier if problem  I have treated Barbara Jordan with a little more systemic steroid and I have  given her azithromycin to cover mycoplasma infection giving rise to significant inflammation of her airway.  She will also use a combination of Symbicort and Asmanex at this point until she completely clears.  If she does well I will see her back in this clinic in 12 months or earlier if there is a problem.  Laurette Schimke, MD Allergy / Immunology [facility]

## 2018-02-15 ENCOUNTER — Encounter: Payer: Self-pay | Admitting: Allergy and Immunology

## 2018-03-11 ENCOUNTER — Ambulatory Visit (INDEPENDENT_AMBULATORY_CARE_PROVIDER_SITE_OTHER): Payer: Commercial Managed Care - PPO | Admitting: *Deleted

## 2018-03-11 DIAGNOSIS — J309 Allergic rhinitis, unspecified: Secondary | ICD-10-CM

## 2018-03-26 ENCOUNTER — Ambulatory Visit (INDEPENDENT_AMBULATORY_CARE_PROVIDER_SITE_OTHER): Payer: Commercial Managed Care - PPO | Admitting: *Deleted

## 2018-03-26 DIAGNOSIS — J309 Allergic rhinitis, unspecified: Secondary | ICD-10-CM

## 2018-03-28 ENCOUNTER — Other Ambulatory Visit: Payer: Self-pay | Admitting: Allergy and Immunology

## 2018-04-09 ENCOUNTER — Ambulatory Visit (INDEPENDENT_AMBULATORY_CARE_PROVIDER_SITE_OTHER): Payer: Commercial Managed Care - PPO | Admitting: *Deleted

## 2018-04-09 DIAGNOSIS — J309 Allergic rhinitis, unspecified: Secondary | ICD-10-CM | POA: Diagnosis not present

## 2018-04-26 ENCOUNTER — Ambulatory Visit (INDEPENDENT_AMBULATORY_CARE_PROVIDER_SITE_OTHER): Payer: Commercial Managed Care - PPO | Admitting: *Deleted

## 2018-04-26 DIAGNOSIS — J309 Allergic rhinitis, unspecified: Secondary | ICD-10-CM | POA: Diagnosis not present

## 2018-05-13 ENCOUNTER — Ambulatory Visit (INDEPENDENT_AMBULATORY_CARE_PROVIDER_SITE_OTHER): Payer: Commercial Managed Care - PPO | Admitting: *Deleted

## 2018-05-13 DIAGNOSIS — J309 Allergic rhinitis, unspecified: Secondary | ICD-10-CM

## 2018-06-11 ENCOUNTER — Ambulatory Visit (INDEPENDENT_AMBULATORY_CARE_PROVIDER_SITE_OTHER): Payer: Commercial Managed Care - PPO | Admitting: *Deleted

## 2018-06-11 DIAGNOSIS — J309 Allergic rhinitis, unspecified: Secondary | ICD-10-CM

## 2018-06-24 ENCOUNTER — Ambulatory Visit (INDEPENDENT_AMBULATORY_CARE_PROVIDER_SITE_OTHER): Payer: Commercial Managed Care - PPO | Admitting: *Deleted

## 2018-06-24 DIAGNOSIS — J309 Allergic rhinitis, unspecified: Secondary | ICD-10-CM

## 2018-06-25 ENCOUNTER — Other Ambulatory Visit: Payer: Self-pay | Admitting: Obstetrics and Gynecology

## 2018-06-25 DIAGNOSIS — Z1231 Encounter for screening mammogram for malignant neoplasm of breast: Secondary | ICD-10-CM

## 2018-07-13 DIAGNOSIS — J3089 Other allergic rhinitis: Secondary | ICD-10-CM

## 2018-07-13 NOTE — Progress Notes (Signed)
Vials exp 07-14-19

## 2018-07-15 ENCOUNTER — Ambulatory Visit (INDEPENDENT_AMBULATORY_CARE_PROVIDER_SITE_OTHER): Payer: Commercial Managed Care - PPO | Admitting: *Deleted

## 2018-07-15 DIAGNOSIS — J309 Allergic rhinitis, unspecified: Secondary | ICD-10-CM

## 2018-08-02 ENCOUNTER — Ambulatory Visit (INDEPENDENT_AMBULATORY_CARE_PROVIDER_SITE_OTHER): Payer: Commercial Managed Care - PPO | Admitting: *Deleted

## 2018-08-02 DIAGNOSIS — J309 Allergic rhinitis, unspecified: Secondary | ICD-10-CM

## 2018-08-19 ENCOUNTER — Ambulatory Visit (INDEPENDENT_AMBULATORY_CARE_PROVIDER_SITE_OTHER): Payer: Commercial Managed Care - PPO

## 2018-08-19 DIAGNOSIS — J309 Allergic rhinitis, unspecified: Secondary | ICD-10-CM | POA: Diagnosis not present

## 2018-08-23 ENCOUNTER — Ambulatory Visit: Payer: Commercial Managed Care - PPO

## 2018-09-09 ENCOUNTER — Ambulatory Visit (INDEPENDENT_AMBULATORY_CARE_PROVIDER_SITE_OTHER): Payer: Commercial Managed Care - PPO | Admitting: *Deleted

## 2018-09-09 DIAGNOSIS — J309 Allergic rhinitis, unspecified: Secondary | ICD-10-CM

## 2018-09-27 ENCOUNTER — Ambulatory Visit
Admission: RE | Admit: 2018-09-27 | Discharge: 2018-09-27 | Disposition: A | Discharge status: Home / Self Care | Payer: Commercial Managed Care - PPO | Source: Ambulatory Visit | Attending: Obstetrics and Gynecology | Admitting: Obstetrics and Gynecology

## 2018-09-27 DIAGNOSIS — Z1231 Encounter for screening mammogram for malignant neoplasm of breast: Secondary | ICD-10-CM

## 2018-09-30 ENCOUNTER — Ambulatory Visit (INDEPENDENT_AMBULATORY_CARE_PROVIDER_SITE_OTHER): Payer: Commercial Managed Care - PPO | Admitting: *Deleted

## 2018-09-30 DIAGNOSIS — J309 Allergic rhinitis, unspecified: Secondary | ICD-10-CM

## 2018-10-15 ENCOUNTER — Ambulatory Visit (INDEPENDENT_AMBULATORY_CARE_PROVIDER_SITE_OTHER): Payer: Commercial Managed Care - PPO | Admitting: *Deleted

## 2018-10-15 DIAGNOSIS — J309 Allergic rhinitis, unspecified: Secondary | ICD-10-CM

## 2018-11-19 ENCOUNTER — Ambulatory Visit (INDEPENDENT_AMBULATORY_CARE_PROVIDER_SITE_OTHER): Payer: Commercial Managed Care - PPO | Admitting: *Deleted

## 2018-11-19 DIAGNOSIS — J309 Allergic rhinitis, unspecified: Secondary | ICD-10-CM

## 2018-12-02 ENCOUNTER — Ambulatory Visit (INDEPENDENT_AMBULATORY_CARE_PROVIDER_SITE_OTHER): Payer: Commercial Managed Care - PPO | Admitting: *Deleted

## 2018-12-02 DIAGNOSIS — J309 Allergic rhinitis, unspecified: Secondary | ICD-10-CM | POA: Diagnosis not present

## 2018-12-24 ENCOUNTER — Ambulatory Visit (INDEPENDENT_AMBULATORY_CARE_PROVIDER_SITE_OTHER): Payer: Commercial Managed Care - PPO | Admitting: *Deleted

## 2018-12-24 DIAGNOSIS — J309 Allergic rhinitis, unspecified: Secondary | ICD-10-CM | POA: Diagnosis not present

## 2019-01-06 ENCOUNTER — Ambulatory Visit (INDEPENDENT_AMBULATORY_CARE_PROVIDER_SITE_OTHER): Payer: Commercial Managed Care - PPO | Admitting: *Deleted

## 2019-01-06 DIAGNOSIS — J309 Allergic rhinitis, unspecified: Secondary | ICD-10-CM

## 2019-01-19 ENCOUNTER — Encounter: Payer: Self-pay | Admitting: Pulmonary Disease

## 2019-01-19 ENCOUNTER — Ambulatory Visit (INDEPENDENT_AMBULATORY_CARE_PROVIDER_SITE_OTHER): Payer: Commercial Managed Care - PPO | Admitting: Pulmonary Disease

## 2019-01-19 DIAGNOSIS — R0600 Dyspnea, unspecified: Secondary | ICD-10-CM

## 2019-01-19 DIAGNOSIS — J4540 Moderate persistent asthma, uncomplicated: Secondary | ICD-10-CM | POA: Diagnosis not present

## 2019-01-19 NOTE — Progress Notes (Signed)
PULMONARY CONSULT NOTE  Requesting MD/Service: Self Date of initial consultation: 01/19/19 Reason for consultation: Asthma  PT PROFILE: 48 y.o. female never smoker with long standing asthma, previously follwed by Dr Lucie Leather of [facility] (in [place]) and maintained on ICS  DATA: 09/11/16 office spiro: FEV1: 1.96 L, FVC: 2.63 L, FEV1%: 75% 05/11/17 office spiro: FEV1: 2.02 L, FVC: 2.60 L, FEV1%: 78% 02/01/18 office spiro: FEV1: 2.07 L, FVC: 2.67 L, FEV1%: 78% 02/11/18 office spiro: FEV1: 1.97 L, FVC: 2.50 L, FEV1%: 79%   INTERVAL:  Virtual Visit via Telephone Note I connected with Rodena Medin Hakim on 01/19/19 at 11:30 AM EDT by telephone and verified that I am speaking with the correct person using two identifiers. I discussed the limitations, risks, security and privacy concerns of performing an evaluation and management service by telephone and the availability of in person appointments. I also discussed with the patient that there may be a patient responsible charge related to this service. The patient expressed understanding and agreed to proceed.   HPI:  She has a longstanding history of asthma, first diagnosed at age 65 when she began to exercise regularly while in college.  In retrospect, she notes that as a child she was "sick" all the time with frequent bouts of "bronchitis".  She also has a history of multiple seasonal allergies dating back to childhood.  When she was first diagnosed with asthma, she saw Dr. Jethro Bolus in [place] and was treated with immunotherapy and albuterol as needed.  She recalls these therapies as being significantly beneficial.  More recently, she has been treated by Dr. Lucie Leather and is still undergoing immunotherapy.  She is maintained on mometasone (Asmanex) inhaler.  She has an albuterol inhaler which she rarely uses.  She has never been hospitalized with asthma.  Her symptoms tend to be worse in the summer.  Exercise can  occasionally exacerbate asthma symptoms as well.  On average, she requires prednisone for exacerbations approximately 1 time per year.  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  While wearing the mask, she has noted increased shortness of breath.  More recently, she has changed to a conventional mask with a face shield.  This has helped her symptoms.  She is friends with one of the respiratory therapist here at [facility] who recommended that she seek further evaluation by pulmonary medicine.  Overall, she believes her asthma control remains good.  Her symptoms of concern only occur during work.  She is very active and exercises regularly, even participating in CrossFit.  Occasionally, she will use albuterol prior to exercise as a pretreatment.  She has never smoked.  She has no significant occupational or environmental exposures.  She has no significant travel history.  She has had no recent chest x-rays.  She denies CP, fever, purulent sputum, hemoptysis, LE edema and calf tenderness.  Past Medical History:  Diagnosis Date  . Allergic rhinoconjunctivitis   . Asthma   . Hypothyroidism     Past Surgical History:  Procedure Laterality Date  . biospy     Fibrocystic breast disease with biopsy  . BREAST EXCISIONAL BIOPSY Left   . BREAST REDUCTION SURGERY    . CESAREAN SECTION    . REDUCTION MAMMAPLASTY Bilateral    about 8 years ago    MEDICATIONS: I have reviewed all medications and confirmed regimen as documented  Social History  Socioeconomic History  . Marital status: Married    Spouse name: Not on file  . Number of children: Not on file  . Years of education: Not on file  . Highest education level: Not on file  Occupational History  . Not on file  Social Needs  . Financial resource strain: Not on file  . Food insecurity:    Worry: Not on file    Inability: Not on file  . Transportation  needs:    Medical: Not on file    Non-medical: Not on file  Tobacco Use  . Smoking status: Never Smoker  . Smokeless tobacco: Never Used  Substance and Sexual Activity  . Alcohol use: Not on file  . Drug use: Not on file  . Sexual activity: Not on file  Lifestyle  . Physical activity:    Days per week: Not on file    Minutes per session: Not on file  . Stress: Not on file  Relationships  . Social connections:    Talks on phone: Not on file    Gets together: Not on file    Attends religious service: Not on file    Active member of club or organization: Not on file    Attends meetings of clubs or organizations: Not on file    Relationship status: Not on file  . Intimate partner violence:    Fear of current or ex partner: Not on file    Emotionally abused: Not on file    Physically abused: Not on file    Forced sexual activity: Not on file  Other Topics Concern  . Not on file  Social History Narrative  . Not on file    Family History  Problem Relation Age of Onset  . Allergic rhinitis Brother   . Breast cancer Mother   . Breast cancer Maternal Grandmother     ROS: No fever, myalgias/arthralgias, unexplained weight loss or weight gain No new focal weakness or sensory deficits No otalgia, hearing loss, visual changes, nasal and sinus symptoms, mouth and throat problems No neck pain or adenopathy No abdominal pain, N/V/D, diarrhea, change in bowel pattern No dysuria, change in urinary pattern   There were no vitals filed for this visit.   EXAM:  Due to the remote nature of this encounter, no physical examination could be performed.  DATA:   No flowsheet data found.  No flowsheet data found.  CXR:  None available  I have personally reviewed all chest radiographs reported above including CXRs and CT chest unless otherwise indicated  IMPRESSION:     ICD-10-CM   1. Moderate persistent asthma without complication J45.40 Pulmonary Function Test [facility] Only    DG  Chest 2 View  2. Dyspnea, unspecified type R06.00      PLAN:  Continue Asmanex inhaler twice a day as currently prescribed.  Rinse mouth after use Continue albuterol inhaler as needed Follow-up in 4-6 weeks with PFTs and CXR prior to that visit. Upon follow-up, depending on results of PFTs and CXR, we will consider change to ICS/LABA or add montelukast or continue current plan   Billy Fischeravid Simonds, MD PCCM service Mobile [phone] Pager [phone] 01/19/2019 1:20 PM

## 2019-01-19 NOTE — Patient Instructions (Signed)
Continue Asmanex inhaler Continue albuterol inhaler as needed Follow-up in 4-6 weeks with PFTs and CXR prior to that visit.  Call sooner if needed

## 2019-01-27 ENCOUNTER — Ambulatory Visit (INDEPENDENT_AMBULATORY_CARE_PROVIDER_SITE_OTHER): Payer: Commercial Managed Care - PPO | Admitting: *Deleted

## 2019-01-27 DIAGNOSIS — J309 Allergic rhinitis, unspecified: Secondary | ICD-10-CM

## 2019-02-11 ENCOUNTER — Ambulatory Visit (INDEPENDENT_AMBULATORY_CARE_PROVIDER_SITE_OTHER): Payer: Commercial Managed Care - PPO

## 2019-02-11 ENCOUNTER — Other Ambulatory Visit: Payer: Self-pay

## 2019-02-11 DIAGNOSIS — J309 Allergic rhinitis, unspecified: Secondary | ICD-10-CM

## 2019-02-21 ENCOUNTER — Other Ambulatory Visit: Admission: RE | Admit: 2019-02-21 | Payer: Commercial Managed Care - PPO | Source: Ambulatory Visit

## 2019-02-24 ENCOUNTER — Ambulatory Visit: Payer: Commercial Managed Care - PPO

## 2019-02-28 ENCOUNTER — Ambulatory Visit: Payer: Commercial Managed Care - PPO | Admitting: Pulmonary Disease

## 2019-03-02 ENCOUNTER — Other Ambulatory Visit: Payer: Self-pay

## 2019-03-02 NOTE — Progress Notes (Signed)
VIALS EXP 03-01-2020 

## 2019-03-03 DIAGNOSIS — J3089 Other allergic rhinitis: Secondary | ICD-10-CM | POA: Diagnosis not present

## 2019-03-09 ENCOUNTER — Encounter: Payer: Self-pay | Admitting: Allergy and Immunology

## 2019-03-10 ENCOUNTER — Ambulatory Visit (INDEPENDENT_AMBULATORY_CARE_PROVIDER_SITE_OTHER): Payer: Commercial Managed Care - PPO | Admitting: *Deleted

## 2019-03-10 DIAGNOSIS — J309 Allergic rhinitis, unspecified: Secondary | ICD-10-CM | POA: Diagnosis not present

## 2019-03-21 ENCOUNTER — Telehealth: Payer: Self-pay | Admitting: Pulmonary Disease

## 2019-03-21 NOTE — Telephone Encounter (Signed)
Lm to relay date/time of covid test.  03/25/2019 between 12:30[facility]2:[facility]

## 2019-03-22 NOTE — Telephone Encounter (Signed)
Called and spoke to pt, who stated that she did not wish to proceed with PFT, due to error with previous PFT scheduling.   Advised pt to contact our office if she wished to proceed with test in the future. Nothing further is needed at this time.   [name], can you please cancel PFT. Thanks

## 2019-03-22 NOTE — Telephone Encounter (Signed)
Spoke with Pamala Hurry and PFT has been canceled. Pt will call back to R/S if pt wishes to proceed with test in the future. Rhonda J Cobb

## 2019-03-28 ENCOUNTER — Other Ambulatory Visit: Payer: Commercial Managed Care - PPO

## 2019-03-30 ENCOUNTER — Ambulatory Visit: Payer: Commercial Managed Care - PPO

## 2019-04-01 ENCOUNTER — Ambulatory Visit (INDEPENDENT_AMBULATORY_CARE_PROVIDER_SITE_OTHER): Payer: Commercial Managed Care - PPO | Admitting: *Deleted

## 2019-04-01 DIAGNOSIS — J309 Allergic rhinitis, unspecified: Secondary | ICD-10-CM

## 2019-04-12 ENCOUNTER — Other Ambulatory Visit: Payer: Self-pay

## 2019-04-12 ENCOUNTER — Telehealth: Payer: Self-pay

## 2019-04-12 ENCOUNTER — Telehealth: Payer: Self-pay | Admitting: *Deleted

## 2019-04-12 MED ORDER — ASMANEX (60 METERED DOSES) 220 MCG/INH IN AEPB: INHALATION_SPRAY | RESPIRATORY_TRACT | 0 refills | Status: DC

## 2019-04-12 NOTE — Telephone Encounter (Signed)
One refill for Asmanex 220 sent to [facility] in [place]

## 2019-04-12 NOTE — Telephone Encounter (Signed)
Left message for patient to call the office

## 2019-04-12 NOTE — Telephone Encounter (Signed)
Please inform patient that insurance has denied use of Asmanex unless she failed several different forms of inhaled steroid.  Let us have her try Arnuity 200-1 inhalation 1 time per day

## 2019-04-12 NOTE — Telephone Encounter (Addendum)
PA started for Asmanex through cover my meds:  Denied, pt needs to try and fail all alternatives  Key: [id number] - PA Case ID: [id number] - Rx #: [id number]  Dr Neldon Mc:  Please choose an alternative for your patient.  Pt has already tried and failed Arnuity Ellipta, pt has not tried any of the other alternatives.  Alternatives include:  Pt needs to try and fail:  BRNDS ARNUITY ELLIPTA_FLOVNT DISKUS_FLOVNT [facility]   OR MD NEC PA [id number] DRUG REQUIRES PRIOR AUTHORIZATION(PHARMACY HELP DESK [phone])  Your information has been submitted to [facility]. To check for an updated outcome later, reopen this PA request from your dashboard. If [employer] has not responded to your request within 24 hours, contact [place] at [phone]. If you think there may be a problem with your PA request, use our live chat feature at the bottom right.

## 2019-04-12 NOTE — Telephone Encounter (Signed)
Patient needs refill for Asmanex sent to [facility] in [place].

## 2019-04-13 ENCOUNTER — Other Ambulatory Visit: Payer: Self-pay | Admitting: *Deleted

## 2019-04-13 MED ORDER — ARNUITY ELLIPTA 200 MCG/ACT IN AEPB: 1.0000 | INHALATION_SPRAY | Freq: Every day | RESPIRATORY_TRACT | 5 refills | Status: DC

## 2019-04-13 NOTE — Telephone Encounter (Signed)
Juliann Pulse informed of change and rx sent to [facility] on [address]

## 2019-04-25 ENCOUNTER — Ambulatory Visit (INDEPENDENT_AMBULATORY_CARE_PROVIDER_SITE_OTHER): Payer: Commercial Managed Care - PPO | Admitting: *Deleted

## 2019-04-25 DIAGNOSIS — J309 Allergic rhinitis, unspecified: Secondary | ICD-10-CM

## 2019-05-16 ENCOUNTER — Encounter: Payer: Self-pay | Admitting: Allergy and Immunology

## 2019-05-16 ENCOUNTER — Other Ambulatory Visit: Payer: Self-pay

## 2019-05-16 ENCOUNTER — Ambulatory Visit (INDEPENDENT_AMBULATORY_CARE_PROVIDER_SITE_OTHER): Payer: Commercial Managed Care - PPO | Admitting: Allergy and Immunology

## 2019-05-16 VITALS — BP 122/84 | HR 55 | Temp 98.1°F | Resp 16 | Ht 65.5 in | Wt 138.2 lb

## 2019-05-16 DIAGNOSIS — J309 Allergic rhinitis, unspecified: Secondary | ICD-10-CM | POA: Diagnosis not present

## 2019-05-16 DIAGNOSIS — J4540 Moderate persistent asthma, uncomplicated: Secondary | ICD-10-CM | POA: Diagnosis not present

## 2019-05-16 NOTE — Progress Notes (Signed)
[place]   Follow-up Note  Referring Provider: Ernestene Kiel, MD Primary Provider: Ernestene Kiel, MD Date of Office Visit: 05/16/2019  Subjective:   Barbara Jordan (DOB: February 04, 1971) is a 48 y.o. female who [facility] 05[facility]16/2019 in re-evaluation of the following:  HPI: Barbara Jordan returns to this clinic in evaluation of asthma and allergic rhinitis.  I last saw her in this clinic on 11 February 2018 at which point in time she was having an exacerbation precipitated by what appeared to be a viral respiratory tract infection.  Overall she has had an excellent year with her asthma.  She can exercise without difficulty and rarely uses a short acting bronchodilator.  She did have an asthma flare that she treated with self administration of oral steroids and Augmentin in December 2019 that appeared to be precipitated by a febrile illness.  Other than that event she has not required any antibiotics or systemic steroids for an airway issue.  She no longer uses Asmanex as insurance has denied the use of this medication and she is using Arnuity.  She has had very little problems with her nose at this point in time while using a nasal steroid mostly on a daily basis.  She continues on immunotherapy currently administered every 3 weeks without adverse effect.  Allergies as of 05/16/2019      Reactions   Hydrocodone    Latex       Medication List    ADRENAL PO Take by mouth.   albuterol (2.5 MG/3ML) 0.083% nebulizer solution Commonly known as: PROVENTIL Take 2.5 mg by nebulization every 4 (four) hours as needed for wheezing or shortness of breath.   Albuterol Sulfate 108 (90 Base) MCG/ACT Aepb Commonly known as: ProAir RespiClick Inhale 2 Doses into the lungs every 4 (four) hours as needed. Inhale two puffs every four to six hours as needed for cough or wheeze.   Armour Thyroid 60 MG tablet Generic  drug: thyroid Take 60 mg by mouth daily.   Arnuity Ellipta 200 MCG/ACT Aepb Generic drug: Fluticasone Furoate Inhale 1 puff into the lungs daily.   B COMPLEX-B12 PO Take by mouth daily.   COQ10 PO Take by mouth.   DHEA PO Take by mouth daily.   EPIPEN 2-PAK IJ Inject as directed as needed.   fluticasone 50 MCG/ACT nasal spray Commonly known as: FLONASE Place 1 spray into both nostrils daily.   KRILL OIL PO Take by mouth.   liothyronine 5 MCG tablet Commonly known as: CYTOMEL   LYSINE PO Take by mouth. C-Lysine   MAGNESIUM CHLORIDE ER PO Take by mouth.   NALTREXONE HCL PO Take by mouth daily.   NASAL SALINE NA Place into the nose as needed.   PRESCRIPTION MEDICATION Testosterone   PROBIOTIC DAILY PO Take by mouth.   progesterone 100 MG capsule Commonly known as: PROMETRIUM   TURMERIC PO Take by mouth.   VITAMIN D PO Take by mouth.       Past Medical History:  Diagnosis Date  . Allergic rhinoconjunctivitis   . Asthma   . Hypothyroidism     Past Surgical History:  Procedure Laterality Date  . biospy     Fibrocystic breast disease with biopsy  . BREAST EXCISIONAL BIOPSY Left   . BREAST REDUCTION SURGERY    . CESAREAN SECTION    . REDUCTION MAMMAPLASTY Bilateral    about 8 years ago  Review of systems negative except as noted in HPI / PMHx or noted below:  Review of Systems  Constitutional: Negative.   HENT: Negative.   Eyes: Negative.   Respiratory: Negative.   Cardiovascular: Negative.   Gastrointestinal: Negative.   Genitourinary: Negative.   Musculoskeletal: Negative.   Skin: Negative.   Neurological: Negative.   Endo/Heme/Allergies: Negative.   Psychiatric/Behavioral: Negative.      Objective:   Vitals:   05/16/19 0842  BP: 122/84  Jordan: (!) 55  Resp: 16  Temp: 98.1 F (36.7 C)  SpO2: 97%   Height: 5' 5.5" (166.4 cm)  Weight: 138 lb 3.2 oz (62.7 kg)   Physical Exam Constitutional:      Appearance: She is  not diaphoretic.  HENT:     Head: Normocephalic.     Right Ear: Tympanic membrane, ear canal and external ear normal.     Left Ear: Tympanic membrane, ear canal and external ear normal.     Nose: Nose normal. No mucosal edema or rhinorrhea.     Mouth/Throat:     Pharynx: Uvula midline. No oropharyngeal exudate.  Eyes:     Conjunctiva/sclera: Conjunctivae normal.  Neck:     Thyroid: No thyromegaly.     Trachea: Trachea normal. No tracheal tenderness or tracheal deviation.  Cardiovascular:     Rate and Rhythm: Normal rate and regular rhythm.     Heart sounds: Normal heart sounds, S1 normal and S2 normal. No murmur.  Pulmonary:     Effort: No respiratory distress.     Breath sounds: Normal breath sounds. No stridor. No wheezing or rales.  Lymphadenopathy:     Head:     Right side of head: No tonsillar adenopathy.     Left side of head: No tonsillar adenopathy.     Cervical: No cervical adenopathy.  Skin:    Findings: No erythema or rash.     Nails: There is no clubbing.   Neurological:     Mental Status: She is alert.     Diagnostics:    Spirometry was performed and demonstrated an FEV1 of 2.22 at 73 % of predicted.  Assessment and Plan:   1. Asthma, moderate persistent, well-controlled   2. Allergic rhinitis, unspecified seasonality, unspecified trigger     1. Continue Arnuity 200 - 1 inhalation 3-7 times a week depending on disease activity  2. Continue flonase 1-2 sprays each nostril 3-7 times per week depending on disease activity   3. Continue Proair Respiclick or albuterol nebulization and antihistamine and Mucinex DM if needed  4. Continue immunotherapy and Epi-pen  5. For "action plan" use the following:   A.  Start Symbicort 160 (SAMPLE) - 2 inhalations twice a day w/ spacer   D.  Increase Arnuity - 2 inhalations twice a day  C.  Use albuterol if needed  6. Return to clinic in 12 months or earlier if problem  Overall Barbara Jordan has done relatively well with  her asthma while utilizing an inhaled steroid and her nose has been doing well while using Flonase in conjunction with immunotherapy.  It does not appear as though asthma has been affecting her lifestyle very much as she is still able to exercise without any problem and her requirement for short acting bronchodilator is relatively low.  We will keep her on this plan and see how she does over the course of the next year.  If she has recurrent exacerbations then we will need to change her plan.  Fortunately,  she has only had one exacerbation over the course of the past 12 months that appeared to be precipitated by a viral illness.  Laurette Schimke, MD Allergy / Immunology [facility]

## 2019-05-16 NOTE — Patient Instructions (Addendum)
  1. Continue Arnuity 200 - 1 inhalation 3-7 times a week depending on disease activity  2. Continue flonase 1-2 sprays each nostril 3-7 times per week depending on disease activity   3. Continue Proair Respiclick or albuterol nebulization and antihistamine and Mucinex DM if needed  4. Continue immunotherapy and Epi-pen  5. For "action plan" use the following:   A.  Start Symbicort 160 (SAMPLE) - 2 inhalations twice a day w/ spacer   D.  Increase Arnuity - 2 inhalations twice a day  C.  Use albuterol if needed  6. Return to clinic in 12 months or earlier if problem

## 2019-05-17 ENCOUNTER — Encounter: Payer: Self-pay | Admitting: Allergy and Immunology

## 2019-06-20 ENCOUNTER — Ambulatory Visit (INDEPENDENT_AMBULATORY_CARE_PROVIDER_SITE_OTHER): Payer: Commercial Managed Care - PPO | Admitting: *Deleted

## 2019-06-20 DIAGNOSIS — J309 Allergic rhinitis, unspecified: Secondary | ICD-10-CM

## 2019-07-14 ENCOUNTER — Ambulatory Visit (INDEPENDENT_AMBULATORY_CARE_PROVIDER_SITE_OTHER): Payer: Commercial Managed Care - PPO | Admitting: *Deleted

## 2019-07-14 DIAGNOSIS — J309 Allergic rhinitis, unspecified: Secondary | ICD-10-CM

## 2019-08-18 ENCOUNTER — Ambulatory Visit (INDEPENDENT_AMBULATORY_CARE_PROVIDER_SITE_OTHER): Payer: Commercial Managed Care - PPO

## 2019-08-18 DIAGNOSIS — J309 Allergic rhinitis, unspecified: Secondary | ICD-10-CM

## 2019-08-22 ENCOUNTER — Other Ambulatory Visit: Payer: Self-pay | Admitting: Obstetrics and Gynecology

## 2019-08-22 DIAGNOSIS — Z1231 Encounter for screening mammogram for malignant neoplasm of breast: Secondary | ICD-10-CM

## 2019-09-08 ENCOUNTER — Ambulatory Visit (INDEPENDENT_AMBULATORY_CARE_PROVIDER_SITE_OTHER): Payer: Commercial Managed Care - PPO | Admitting: *Deleted

## 2019-09-08 DIAGNOSIS — J309 Allergic rhinitis, unspecified: Secondary | ICD-10-CM

## 2019-10-06 ENCOUNTER — Ambulatory Visit (INDEPENDENT_AMBULATORY_CARE_PROVIDER_SITE_OTHER): Payer: Commercial Managed Care - PPO | Admitting: *Deleted

## 2019-10-06 DIAGNOSIS — J309 Allergic rhinitis, unspecified: Secondary | ICD-10-CM | POA: Diagnosis not present

## 2019-10-22 ENCOUNTER — Other Ambulatory Visit: Payer: Self-pay | Admitting: Allergy and Immunology

## 2019-10-24 ENCOUNTER — Ambulatory Visit: Payer: Commercial Managed Care - PPO

## 2019-11-18 ENCOUNTER — Ambulatory Visit (INDEPENDENT_AMBULATORY_CARE_PROVIDER_SITE_OTHER): Payer: Commercial Managed Care - PPO | Admitting: *Deleted

## 2019-11-18 DIAGNOSIS — J309 Allergic rhinitis, unspecified: Secondary | ICD-10-CM

## 2019-11-28 ENCOUNTER — Ambulatory Visit: Payer: Commercial Managed Care - PPO

## 2019-12-08 ENCOUNTER — Ambulatory Visit (INDEPENDENT_AMBULATORY_CARE_PROVIDER_SITE_OTHER): Payer: Commercial Managed Care - PPO | Admitting: *Deleted

## 2019-12-08 DIAGNOSIS — J309 Allergic rhinitis, unspecified: Secondary | ICD-10-CM

## 2019-12-19 ENCOUNTER — Ambulatory Visit
Admission: RE | Admit: 2019-12-19 | Discharge: 2019-12-19 | Disposition: A | Discharge status: Home / Self Care | Payer: Commercial Managed Care - PPO | Source: Ambulatory Visit | Attending: Obstetrics and Gynecology | Admitting: Obstetrics and Gynecology

## 2019-12-19 ENCOUNTER — Other Ambulatory Visit: Payer: Self-pay

## 2019-12-19 DIAGNOSIS — Z1231 Encounter for screening mammogram for malignant neoplasm of breast: Secondary | ICD-10-CM

## 2020-01-09 ENCOUNTER — Ambulatory Visit (INDEPENDENT_AMBULATORY_CARE_PROVIDER_SITE_OTHER): Payer: Commercial Managed Care - PPO | Admitting: *Deleted

## 2020-01-09 DIAGNOSIS — J309 Allergic rhinitis, unspecified: Secondary | ICD-10-CM | POA: Diagnosis not present

## 2020-02-13 ENCOUNTER — Ambulatory Visit (INDEPENDENT_AMBULATORY_CARE_PROVIDER_SITE_OTHER): Payer: Commercial Managed Care - PPO

## 2020-02-13 DIAGNOSIS — J309 Allergic rhinitis, unspecified: Secondary | ICD-10-CM

## 2020-03-13 DIAGNOSIS — J3089 Other allergic rhinitis: Secondary | ICD-10-CM

## 2020-03-13 NOTE — Progress Notes (Signed)
Vials exp 03-13-21 

## 2020-03-23 ENCOUNTER — Ambulatory Visit (INDEPENDENT_AMBULATORY_CARE_PROVIDER_SITE_OTHER): Payer: Commercial Managed Care - PPO | Admitting: *Deleted

## 2020-03-23 DIAGNOSIS — J309 Allergic rhinitis, unspecified: Secondary | ICD-10-CM

## 2020-05-08 ENCOUNTER — Ambulatory Visit (INDEPENDENT_AMBULATORY_CARE_PROVIDER_SITE_OTHER): Payer: Commercial Managed Care - PPO | Admitting: *Deleted

## 2020-05-08 DIAGNOSIS — J309 Allergic rhinitis, unspecified: Secondary | ICD-10-CM | POA: Diagnosis not present

## 2020-06-05 ENCOUNTER — Ambulatory Visit (INDEPENDENT_AMBULATORY_CARE_PROVIDER_SITE_OTHER): Payer: Commercial Managed Care - PPO

## 2020-06-05 DIAGNOSIS — J309 Allergic rhinitis, unspecified: Secondary | ICD-10-CM | POA: Diagnosis not present

## 2020-06-08 ENCOUNTER — Ambulatory Visit: Payer: Commercial Managed Care - PPO | Admitting: Family Medicine

## 2020-06-12 ENCOUNTER — Ambulatory Visit: Payer: Commercial Managed Care - PPO | Admitting: Family Medicine

## 2020-09-10 ENCOUNTER — Telehealth: Payer: Self-pay | Admitting: Allergy and Immunology

## 2020-09-10 ENCOUNTER — Other Ambulatory Visit: Payer: Self-pay | Admitting: *Deleted

## 2020-09-10 MED ORDER — AMOXICILLIN-POT CLAVULANATE 875-125 MG PO TABS: ORAL_TABLET | ORAL | 0 refills | Status: DC

## 2020-09-10 NOTE — Telephone Encounter (Signed)
Continues Augmentin 875 1 tablet twice a day for 10 days.

## 2020-09-10 NOTE — Telephone Encounter (Signed)
[name] informed and rx sent.

## 2020-09-10 NOTE — Telephone Encounter (Signed)
Dr. Kozlow, Please advise.  

## 2020-09-10 NOTE — Telephone Encounter (Signed)
PT had head cold starting last week (jan 3rd), negative for covid pcr test, and how has ear infection. Ear is warm to touch and swollen. Fever yesterday was 99.9. Wants to avoid going to urgent care.   [phone]

## 2020-09-11 ENCOUNTER — Other Ambulatory Visit: Payer: Self-pay | Admitting: Allergy and Immunology

## 2020-12-13 ENCOUNTER — Encounter: Payer: Self-pay | Admitting: Allergy and Immunology

## 2020-12-13 ENCOUNTER — Ambulatory Visit (INDEPENDENT_AMBULATORY_CARE_PROVIDER_SITE_OTHER): Payer: Commercial Managed Care - PPO | Admitting: Allergy and Immunology

## 2020-12-13 ENCOUNTER — Other Ambulatory Visit: Payer: Self-pay

## 2020-12-13 VITALS — BP 108/68 | HR 56 | Resp 18 | Ht 65.7 in | Wt 148.8 lb

## 2020-12-13 DIAGNOSIS — J4540 Moderate persistent asthma, uncomplicated: Secondary | ICD-10-CM

## 2020-12-13 DIAGNOSIS — J3089 Other allergic rhinitis: Secondary | ICD-10-CM | POA: Diagnosis not present

## 2020-12-13 NOTE — Progress Notes (Signed)
Barbara Jordan - Barbara Jordan - Barbara Jordan - Barbara Jordan - Barbara Jordan   Follow-up Note  Referring Provider: Philemon Kingdom, MD Primary Provider: Philemon Kingdom, MD Date of Office Visit: 12/13/2020  Subjective:   Barbara Jordan (DOB: 1971-08-04) is a 50 y.o. female [facility] in re-evaluation of the following:  HPI: Barbara Jordan returns to this clinic in evaluation of asthma and allergic rhinitis.  Her last visit to this clinic was 16 May 2019.  She has really done well with her airway.  Her asthma has not been a particularly big issue while using Arnuity about twice a week and she can exercise with any difficulty and rarely uses a short acting bronchodilator.  It seems like every December she does develop a flareup of her asthma that is usually precipitated by a viral respiratory tract infection.  She required prednisone and Augmentin this December but otherwise has not required any systemic steroids or antibiotics.  Her upper airway issues are going quite well using Flonase on a pretty consistent basis.  She is no longer using immunotherapy.  She had at least 5 years of immunotherapy with a very good effect.  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  Allergies as of 12/13/2020      Reactions   Hydrocodone    Latex       Medication List    ADRENAL PO Take by mouth.   albuterol (2.5 MG/3ML) 0.083% nebulizer solution Commonly known as: PROVENTIL Take 2.5 mg by nebulization every 4 (four) hours as needed for wheezing or shortness of breath.   Albuterol Sulfate 108 (90 Base) MCG/ACT Aepb Commonly known as: ProAir RespiClick Inhale 2 Doses into the lungs every 4 (four) hours as needed. Inhale two puffs every four to six hours as needed for cough or wheeze.   Armour  Thyroid 60 MG tablet Generic drug: thyroid Take 60 mg by mouth daily.   Arnuity Ellipta 200 MCG/ACT Aepb Generic drug: Fluticasone Furoate TAKE 1 PUFF BY MOUTH EVERY DAY   B COMPLEX-B12 PO Take by mouth daily.   COQ10 PO Take by mouth.   DHEA PO Take by mouth daily.   EPIPEN 2-PAK IJ Inject as directed as needed.   fluticasone 50 MCG/ACT nasal spray Commonly known as: FLONASE Place 1 spray into both nostrils daily.   KRILL OIL PO Take by mouth.   liothyronine 5 MCG tablet Commonly known as: CYTOMEL   LYSINE PO Take by mouth. C-Lysine   MAGNESIUM CHLORIDE ER PO Take by mouth.   NALTREXONE HCL PO Take by mouth daily.   NASAL SALINE NA Place into the nose as needed.   PROBIOTIC DAILY PO Take by mouth.   progesterone 100 MG capsule Commonly known as: PROMETRIUM   TURMERIC PO Take by mouth.   VITAMIN D PO Take by mouth.       Past Medical History:  Diagnosis Date  . Allergic rhinoconjunctivitis   . Asthma   . Hypothyroidism     Past Surgical History:  Procedure Laterality Date  . biospy     Fibrocystic breast disease with biopsy  . BREAST EXCISIONAL BIOPSY Left   . BREAST REDUCTION SURGERY    . CESAREAN SECTION    . REDUCTION MAMMAPLASTY Bilateral    about 8 years ago  Review of systems negative except as noted in HPI / PMHx or noted below:  Review of Systems  Constitutional: Negative.   HENT: Negative.   Eyes: Negative.   Respiratory: Negative.   Cardiovascular: Negative.   Gastrointestinal: Negative.   Genitourinary: Negative.   Musculoskeletal: Negative.   Skin: Negative.   Neurological: Negative.   Endo/Heme/Allergies: Negative.   Psychiatric/Behavioral: Negative.      Objective:   Vitals:   12/13/20 1517  BP: 108/68  Pulse: (!) 56  Resp: 18  SpO2: 97%   Height: 5' 5.7" (166.9 cm)  Weight: 148 lb 12.8 oz (67.5 kg)   Physical Exam Constitutional:      Appearance: She is not diaphoretic.  HENT:     Head:  Normocephalic.     Right Ear: Tympanic membrane, ear canal and external ear normal.     Left Ear: Tympanic membrane, ear canal and external ear normal.     Nose: Nose normal. No mucosal edema or rhinorrhea.     Mouth/Throat:     Pharynx: Uvula midline. No oropharyngeal exudate.  Eyes:     Conjunctiva/sclera: Conjunctivae normal.  Neck:     Thyroid: No thyromegaly.     Trachea: Trachea normal. No tracheal tenderness or tracheal deviation.  Cardiovascular:     Rate and Rhythm: Normal rate and regular rhythm.     Heart sounds: Normal heart sounds, S1 normal and S2 normal. No murmur heard.   Pulmonary:     Effort: No respiratory distress.     Breath sounds: Normal breath sounds. No stridor. No wheezing or rales.  Lymphadenopathy:     Head:     Right side of head: No tonsillar adenopathy.     Left side of head: No tonsillar adenopathy.     Cervical: No cervical adenopathy.  Skin:    Findings: No erythema or rash.     Nails: There is no clubbing.  Neurological:     Mental Status: She is alert.     Diagnostics:    Spirometry was performed and demonstrated an FEV1 of 1.71 at 59 % of predicted.   Assessment and Plan:   1. Asthma, moderate persistent, well-controlled   2. Perennial allergic rhinitis     1. Continue Arnuity 200 - 1 inhalation 3-7 times a week depending on disease activity  2. Continue flonase 1-2 sprays each nostril 3-7 times per week depending on disease activity   3. Continue Proair Respiclick or albuterol nebulization and antihistamine and Mucinex DM if needed  4. Return to clinic in 12 months or earlier if problem  There certainly appears to be a discordance between Barbara Jordan's spirometric values and her control of asthma.  At this Jordan she rarely uses a short acting bronchodilator and can exercise without a problem and rarely requires any acute therapy for this condition even though her spirometry shows her at 59% of predicted on FEV1.  We will keep her on  the plan noted above and just see what happens as she moves forward.  She will continue to use a nasal steroid as well to address her upper airway issue.  I will see her back in this clinic in 1 year or earlier if there is a problem.  Barbara Schimke, MD Allergy / Immunology [facility]

## 2020-12-13 NOTE — Patient Instructions (Addendum)
  1. Continue Arnuity 200 - 1 inhalation 3-7 times a week depending on disease activity  2. Continue flonase 1-2 sprays each nostril 3-7 times per week depending on disease activity   3. Continue Proair Respiclick or albuterol nebulization and antihistamine and Mucinex DM if needed  4. Return to clinic in 12 months or earlier if problem

## 2020-12-17 ENCOUNTER — Encounter: Payer: Self-pay | Admitting: Allergy and Immunology

## 2021-06-04 ENCOUNTER — Telehealth: Payer: Self-pay | Admitting: Allergy and Immunology

## 2021-06-04 MED ORDER — ARNUITY ELLIPTA 200 MCG/ACT IN AEPB: INHALATION_SPRAY | RESPIRATORY_TRACT | 5 refills | Status: DC

## 2021-06-04 NOTE — Telephone Encounter (Signed)
[facility] [address]. Patient was informed.

## 2021-06-04 NOTE — Telephone Encounter (Signed)
Patient is requesting a refill on her Arnu[facility] Inhaler [facility] [address].

## 2021-07-01 ENCOUNTER — Other Ambulatory Visit (HOSPITAL_COMMUNITY): Payer: Self-pay

## 2021-07-15 ENCOUNTER — Ambulatory Visit (INDEPENDENT_AMBULATORY_CARE_PROVIDER_SITE_OTHER): Payer: Commercial Managed Care - PPO | Admitting: Allergy and Immunology

## 2021-07-15 ENCOUNTER — Encounter: Payer: Self-pay | Admitting: Allergy and Immunology

## 2021-07-15 ENCOUNTER — Other Ambulatory Visit: Payer: Self-pay

## 2021-07-15 VITALS — BP 112/62 | HR 86 | Resp 16

## 2021-07-15 DIAGNOSIS — J3089 Other allergic rhinitis: Secondary | ICD-10-CM

## 2021-07-15 DIAGNOSIS — J129 Viral pneumonia, unspecified: Secondary | ICD-10-CM

## 2021-07-15 DIAGNOSIS — J4541 Moderate persistent asthma with (acute) exacerbation: Secondary | ICD-10-CM

## 2021-07-15 MED ORDER — AZITHROMYCIN 500 MG PO TABS: 500.0000 mg | ORAL_TABLET | Freq: Every day | ORAL | 0 refills | Status: AC

## 2021-07-15 NOTE — Progress Notes (Signed)
[place] - [place] - [place] - [place] - [place]   Follow-up Note  Referring Provider: Philemon Kingdom, MD Primary Provider: Philemon Kingdom, MD Date of Office Visit: 07/15/2021  Subjective:   Barbara Jordan (DOB: 24-Jan-1971) is a 50 y.o. female who [facility] 07[facility]21 in re-evaluation of the following:  HPI: Barbara Jordan returns to this clinic in reevaluation of asthma and allergic rhinitis.  I have not seen her in this clinic since 13 December 2020.  She was doing very well until 1 week ago.  At that point in time she developed head congestion and stuffiness of her head and lots of fatigue.  Apparently she initially had chills and headache that lasted about 3 days.  She does not know if she actually had a fever.  It should be noted that there was a family member located in a house that also started with the same type of symptoms about 24 hours prior to [facility] onset.  Then, 4 days ago, she developed coughing.  She has been having coughing and wheezing and has been making some ugly sputum production.  She must sleep in a reclined position otherwise she just cannot breathe very well.  She has not been having any chest pain.  Her head is actually a little bit better today with less nasal congestion.  She contacted telephone MD within 24 hours of her head symptoms 1 week ago and was given prednisone at 20 mg a day for 5 days which she has finished.  She then contacted telephone MD 48 hours ago and was started on Augmentin.  She has had 2 doses.  Allergies as of 07/15/2021       Reactions   Hydrocodone    Latex         Medication List    ADRENAL PO Take by mouth.   albuterol (2.5 MG/3ML) 0.083% nebulizer solution Commonly known as: PROVENTIL Take 2.5 mg by nebulization every 4 (four) hours as needed for wheezing or shortness of breath.   Albuterol Sulfate 108 (90 Base) MCG/ACT Aepb Commonly known as: ProAir RespiClick Inhale 2  Doses into the lungs every 4 (four) hours as needed. Inhale two puffs every four to six hours as needed for cough or wheeze.   Armour Thyroid 60 MG tablet Generic drug: thyroid Take 60 mg by mouth daily.   Arnuity Ellipta 200 MCG/ACT Aepb Generic drug: Fluticasone Furoate 1 inhalation 3-7 times per week   B COMPLEX-B12 PO Take by mouth daily.   COQ10 PO Take by mouth.   DHEA PO Take by mouth daily.   EPIPEN 2-PAK IJ Inject as directed as needed.   fluticasone 50 MCG/ACT nasal spray Commonly known as: FLONASE Place 1 spray into both nostrils daily.   KRILL OIL PO Take by mouth.   liothyronine 5 MCG tablet Commonly known as: CYTOMEL   LYSINE PO Take by mouth. C-Lysine   MAGNESIUM CHLORIDE ER PO Take by mouth.   NALTREXONE HCL PO Take by mouth daily.   NASAL SALINE NA Place into the nose as needed.   PROBIOTIC DAILY PO Take by mouth.   progesterone 100 MG capsule Commonly known as: PROMETRIUM   TURMERIC PO Take by mouth.   VITAMIN D PO Take by mouth.    Past Medical History:  Diagnosis Date   Allergic rhinoconjunctivitis    Asthma    Hypothyroidism     Past Surgical History:  Procedure Laterality Date   biospy  Fibrocystic breast disease with biopsy   BREAST EXCISIONAL BIOPSY Left    BREAST REDUCTION SURGERY     CESAREAN SECTION     REDUCTION MAMMAPLASTY Bilateral    about 8 years ago    Review of systems negative except as noted in HPI / PMHx or noted below:  Review of Systems  Constitutional: Negative.   HENT: Negative.    Eyes: Negative.   Respiratory: Negative.    Cardiovascular: Negative.   Gastrointestinal: Negative.   Genitourinary: Negative.   Musculoskeletal: Negative.   Skin: Negative.   Neurological: Negative.   Endo/Heme/Allergies: Negative.   Psychiatric/Behavioral: Negative.      Objective:   Vitals:   07/15/21 1007  BP: 112/62  Pulse: 86  Resp: 16  SpO2: 96%          Physical Exam Constitutional:       Appearance: She is not diaphoretic.  HENT:     Head: Normocephalic.     Right Ear: Tympanic membrane, ear canal and external ear normal.     Left Ear: Tympanic membrane, ear canal and external ear normal.     Nose: Mucosal edema (Erythematous) present. No rhinorrhea.     Mouth/Throat:     Pharynx: Uvula midline. No oropharyngeal exudate.  Eyes:     Conjunctiva/sclera: Conjunctivae normal.  Neck:     Thyroid: No thyromegaly.     Trachea: Trachea normal. No tracheal tenderness or tracheal deviation.  Cardiovascular:     Rate and Rhythm: Normal rate and regular rhythm.     Heart sounds: Normal heart sounds, S1 normal and S2 normal. No murmur heard. Pulmonary:     Effort: No respiratory distress.     Breath sounds: Normal breath sounds. No stridor. No wheezing (Scattered expiratory wheeze and rhonchi left posterior lung field) or rales.  Lymphadenopathy:     Head:     Right side of head: No tonsillar adenopathy.     Left side of head: No tonsillar adenopathy.     Cervical: No cervical adenopathy.  Skin:    Findings: No erythema or rash.     Nails: There is no clubbing.  Neurological:     Mental Status: She is alert.    Diagnostics:    Spirometry was performed and demonstrated an FEV1 of 1.17 at 40 % of predicted.  She had a difficult time performing this maneuver secondary to coughing.  BIANEX NOW COVID SWAB negative  Assessment and Plan:   1. Asthma, not well controlled, moderate persistent, with acute exacerbation   2. Viral pneumonia   3. Perennial allergic rhinitis     1. Continue Arnuity 200 - 1 inhalation 3-7 times a week depending on disease activity. Increase to 2 times per day during flare up.  2. Continue flonase 1-2 sprays each nostril 3-7 times per week depending on disease activity   3. Continue Proair Respiclick or albuterol nebulization and antihistamine and Mucinex DM if needed  4. For this recent episode:   A. Obtain chest X-ray  B. Continue  Augmentin  C. Add Azithromycin 500 mg - 1 tablet 1 time per day for 3 days  D. Substitute AirDuo 232 - 1 inhalation 2 times per day for Arnuity until better  4. Return to clinic in 12 months or earlier if problem  Barbara Jordan's history is consistent with contracting influenza and possibly developing a post viral pneumonia affecting her left lower lobe.  We will obtain a chest x-ray and we will add in azithromycin to  cover atypical organisms while she continues on her Augmentin as broad-spectrum for bacterial organisms.  I am going to have her use AirDuo to replace her Arnuity until she is better.  She has already received systemic steroids and at this point I do not feel very comfortable giving her any more systemic steroids until we have her chest x-ray available to review.  I will contact her with the results of her chest x-ray once it is available for review.  Laurette Schimke, MD Allergy / Immunology [facility]

## 2021-07-15 NOTE — Patient Instructions (Addendum)
  1. Continue Arnuity 200 - 1 inhalation 3-7 times a week depending on disease activity.   2. Continue flonase 1-2 sprays each nostril 3-7 times per week depending on disease activity   3. Continue Proair Respiclick or albuterol nebulization and antihistamine and Mucinex DM if needed  4. For this recent episode:   A. Obtain chest X-ray  B. Continue Augmentin  C. Add Azithromycin 500 mg - 1 tablet 1 time per day for 3 days  D. Substitute AirDuo 232 - 1 inhalation 2 times per day for Arnuity until better  4. Return to clinic in 12 months or earlier if problem

## 2021-07-16 ENCOUNTER — Encounter: Payer: Self-pay | Admitting: Allergy and Immunology

## 2021-07-17 ENCOUNTER — Telehealth: Payer: Self-pay | Admitting: Allergy and Immunology

## 2021-07-17 NOTE — Telephone Encounter (Signed)
Patient said she came in Monday and seen dr Lucie Leather and was put on azithromycin . She said that it is helping and wanted to know if she need another round of it. [phone].

## 2021-07-17 NOTE — Telephone Encounter (Signed)
Spoke with [name], she is a little better. I told her to call us tomorrow afternoon if she is not quite a bit better or earlier if she gets any worse.

## 2021-07-19 ENCOUNTER — Encounter: Payer: Self-pay | Admitting: *Deleted

## 2021-07-24 NOTE — Telephone Encounter (Signed)
Patient called with an update.  She is getting better everyday, however she still has some wheezing and crackling in her left lung and still coughing with some light yellowish phlegm.  She is using the AirDuo 232 about two to three puffs twice daily right now.  She wanted to know if there is anything else she needs to do or change or should she continue with current plan. Please advise.

## 2021-07-24 NOTE — Telephone Encounter (Signed)
We can give her a low-dose of prednisone at 10 mg a day for an additional 10 days to help with the lingering inflammation.  She should use the air duo at 1 inhalation twice a day.

## 2021-07-24 NOTE — Telephone Encounter (Signed)
Patient informed.  Will place Prednisone at front desk for patient to pick up.

## 2021-09-23 ENCOUNTER — Other Ambulatory Visit: Payer: Self-pay | Admitting: *Deleted

## 2021-09-23 ENCOUNTER — Telehealth: Payer: Self-pay | Admitting: Allergy and Immunology

## 2021-09-23 MED ORDER — MONTELUKAST SODIUM 10 MG PO TABS: ORAL_TABLET | ORAL | 0 refills | Status: DC

## 2021-09-23 NOTE — Telephone Encounter (Signed)
[facility] sent to [facility] [place]. Left a message informing [name] that I sent a 30-day supply and if she does well, we can send additional refills.

## 2021-09-23 NOTE — Telephone Encounter (Signed)
Patient states she is having severe congestion and her OTC medication is not helping. She mentioned that in her last OV, Dr. Lucie Leather spoke about possibly using Singulair. Patient is wondering if this is something that can be sent in for her or if she needs to come in for an OV.   [facility] [place]

## 2021-09-23 NOTE — Telephone Encounter (Signed)
Please advise 

## 2021-10-17 ENCOUNTER — Other Ambulatory Visit: Payer: Self-pay | Admitting: Allergy and Immunology

## 2021-10-24 ENCOUNTER — Ambulatory Visit: Payer: Commercial Managed Care - PPO | Admitting: Allergy and Immunology

## 2021-11-12 ENCOUNTER — Ambulatory Visit: Payer: Self-pay | Admitting: Allergy

## 2021-11-19 ENCOUNTER — Encounter: Payer: Self-pay | Admitting: Allergy

## 2021-11-19 ENCOUNTER — Ambulatory Visit: Payer: Managed Care, Other (non HMO) | Admitting: Allergy

## 2021-11-19 ENCOUNTER — Other Ambulatory Visit: Payer: Self-pay

## 2021-11-19 VITALS — BP 136/86 | HR 60 | Resp 18

## 2021-11-19 DIAGNOSIS — L2489 Irritant contact dermatitis due to other agents: Secondary | ICD-10-CM | POA: Diagnosis not present

## 2021-11-19 DIAGNOSIS — J4540 Moderate persistent asthma, uncomplicated: Secondary | ICD-10-CM | POA: Diagnosis not present

## 2021-11-19 DIAGNOSIS — J3089 Other allergic rhinitis: Secondary | ICD-10-CM

## 2021-11-19 DIAGNOSIS — H1013 Acute atopic conjunctivitis, bilateral: Secondary | ICD-10-CM | POA: Diagnosis not present

## 2021-11-19 DIAGNOSIS — L253 Unspecified contact dermatitis due to other chemical products: Secondary | ICD-10-CM

## 2021-11-19 MED ORDER — RYALTRIS 665-25 MCG/ACT NA SUSP: 2.0000 | Freq: Two times a day (BID) | NASAL | 5 refills | Status: DC

## 2021-11-19 NOTE — Patient Instructions (Addendum)
Contact dermatitis flare ?- do you best to eliminate exposure to contact allergens  ?- continue to avoid latex containing products ?- since rash is involving your face recommend you take prednisone 20mg  twice a day for x 5 days ?- use opzelura ointment twice a day as needed to areas on face or body that is red, irritated, itchy, dry, patchy, flaky, scaly.  Sample provided.  ? ?Allergic rhinitis with conjunctivitis ?- recommend you schedule for skin testing visit to see what you are allergic to at this point in time if considering restarting immunotherapy.  Remember to hold antihistamine for 3 days prior to this skin testing visit.  ?- Stop taking: Claritin ?- Start taking: Xyzal (levocetirizine) 5mg  tablet once daily.  This is a long-acting antihistamine that may be more effective than Claritin. Sample provided.  ?Ryaltris (olopatadine/mometasone) two sprays per nostril 1-2 times daily as needed for nasal congestion or drainage.  Sample provided. ?Pataday (olopatadine) one drop per eye daily as needed for itchy/watery eyes.   ?- You can use an extra dose of the antihistamine, if needed, for breakthrough symptoms.  ?- Consider nasal saline rinses 1-2 times daily to remove allergens from the nasal cavities as well as help with mucous clearance (this is especially helpful to do before the nasal sprays are given) ? ?Asthma ?- Continue Arnuity 200 - 1 inhalation 3-7 times a week depending on disease activity.  ?- Continue Proair Respiclick or albuterol nebulization as needed every 4 hours for cough/wheeze/shortness of breath/chest tightness. Monitor frequency of use.   ? ?Return for skin testing   ?

## 2021-11-19 NOTE — Progress Notes (Signed)
? ? ?Follow-up Note ? ?RE: Barbara Jordan MRN: [id number] DOB: June 24, 1971 ?Date of Office Visit: 11/19/2021 ? ? ?History of present illness: ?Barbara Jordan is a 51 y.o. female presenting today for follow-up of allergic rhinitis.  She also has history of asthma.  Today's visit was initially scheduled to discuss re-starting allergen immunotherapy.  Her last immunotherapy injection was 06/05/20 and states was discontinued as she had been on them for "a long time" about 8-9 years.  However since being off she has had more allergy issues and illnesses.  Thus would like to resume. She states since being off she has had to take an antihistamine for her symptoms.  She states this winter was especially bad.  She reports her symptoms include congestion, drainage, itchy eyes, cough/wheeze and these symptoms "ebbs and flows" depending on what she comes in contact with.  Fall and winter are the worst times for her usual.  She is on Claritin currently.  She has tried Careers adviser but feels like it doesn't help any more.  Zyrtec makes her sleepy.  Claritin helps a little.  She has not tried xyzal.  She will use flonase as needed but states has used it for so many years and doesn't feel like it helps anymore.  She has used an nasal antihistamine when she was sick and states it helped.  She will use a re-wetting eye drop for her eye symptoms.   ? ?She states last week she had gotten in contact with latex (she works as a Armed forces operational officer).  She states she usually uses latex free gloves but she went in the lab and saw pink gloves that she put on and states was not sure if it was latex-free or not.  She states her hands and face got red and itchy.   She tried use of HC but this burned.  She then started using like a bacitracin for the ointment/moisture.  ? ?She has a building that she works out of that they (her and her husband) are putting up with particle board.  Her husband was cutting the board/wood outside and states there  was sawdust everywhere.  She was working inside and states the doors were open and she started developed a red rash on her face and hands. She started taking benadryl and she has some prednisone pills (took 2 pill from a taper pack) leftover at home and she states this did help to the point that she was able to get some sleep. ?Today she states she used a blower to blow out the dust from inside the building and within 30 minutes she states her face started to get more itchy and felt like it was on fire.  She took another claritin and benadryl.   She states she has been reading online about part of her boarding cutting and that it can release some type of a chemical that can be an irritant.  She is looking into getting some type of industrial air purifier/air sucker device to help eliminate the chemical and particles in the air as she has to work in this building. ? ?In regards to her asthma she states this is doing okay as long as her allergy symptoms are not terribly flared.  She states she was advised by Dr. Lucie Leather to use her Arnuity several times a week depending on her symptom severity.  She thus has been using it several times a week.  She denies any use of her albuterol. ? ?Review of systems: ?  Review of Systems  ?Constitutional: Negative.   ?HENT:    ?     See HPI  ?Eyes:   ?     See HPI  ?Respiratory: Negative.    ?Cardiovascular: Negative.   ?Gastrointestinal: Negative.   ?Musculoskeletal: Negative.   ?Skin:   ?     See HPI  ?Allergic/Immunologic: Negative.   ?Neurological: Negative.    ? ?All other systems negative unless noted above in HPI ? ?Past medical/social/surgical/family history have been reviewed and are unchanged unless specifically indicated below. ? ?No changes ? ?Medication List: ?Current Outpatient Medications  ?Medication Sig Dispense Refill  ? albuterol (PROVENTIL) (2.5 MG/3ML) 0.083% nebulizer solution Take 2.5 mg by nebulization every 4 (four) hours as needed for wheezing or shortness of  breath.    ? Albuterol Sulfate (PROAIR RESPICLICK) 108 (90 Base) MCG/ACT AEPB Inhale 2 Doses into the lungs every 4 (four) hours as needed. Inhale two puffs every four to six hours as needed for cough or wheeze. 1 each 0  ? ARMOUR THYROID 60 MG tablet Take 60 mg by mouth daily.  0  ? B Complex Vitamins (B COMPLEX-B12 PO) Take by mouth daily.    ? Cholecalciferol (VITAMIN D PO) Take by mouth.    ? Coenzyme Q10 (COQ10 PO) Take by mouth.    ? Fluticasone Furoate (ARNUITY ELLIPTA) 200 MCG/ACT AEPB 1 inhalation 3-7 times per week 30 each 5  ? KRILL OIL PO Take by mouth.    ? liothyronine (CYTOMEL) 5 MCG tablet   0  ? LYSINE PO Take by mouth. C-Lysine    ? MAGNESIUM CHLORIDE ER PO Take by mouth.    ? Misc Natural Products (ADRENAL PO) Take by mouth.    ? NALTREXONE HCL PO Take by mouth daily.    ? NASAL SALINE NA Place into the nose as needed.    ? Nutritional Supplements (DHEA PO) Take by mouth daily.    ? Olopatadine-Mometasone (RYALTRIS) [id number] MCG/ACT SUSP Place 2 sprays into the nose 2 (two) times daily. 29 g 5  ? Probiotic Product (PROBIOTIC DAILY PO) Take by mouth.    ? progesterone (PROMETRIUM) 100 MG capsule   0  ? TURMERIC PO Take by mouth.    ? ?No current facility-administered medications for this visit.  ?  ? ?Known medication allergies: ?Allergies  ?Allergen Reactions  ? Hydrocodone   ? Latex   ? ? ? ?Physical examination: ?Blood pressure 136/86, pulse 60, resp. rate 18, SpO2 99 %. ? ?General: Alert, interactive, in no acute distress. ?HEENT: PERRLA, TMs pearly gray, turbinates moderately edematous with clear discharge, post-pharynx non erythematous. ?Neck: Supple without lymphadenopathy. ?Lungs: Clear to auscultation without wheezing, rhonchi or rales. {no increased work of breathing. ?CV: Normal S1, S2 without murmurs. ?Abdomen: Nondistended, nontender. ?Skin: Erythematous, dry, patchy rash around the nasolabial folds extending toward the eye and around the mouth . ?Extremities:  No clubbing, cyanosis or  edema. ?Neuro:   Grossly intact. ? ?Diagnositics/Labs: ?None today ? ?Assessment and plan: ?  ?Contact dermatitis flare ?- do you best to eliminate exposure to contact allergens  ?- continue to avoid latex containing products ?- since rash is involving your face recommend you take prednisone 20mg  twice a day for x 5 days ?- use opzelura ointment twice a day as needed to areas on face or body that is red, irritated, itchy, dry, patchy, flaky, scaly.  Advised that should not burn with application.  Sample provided.  ? ?Allergic rhinitis with conjunctivitis ?-  recommend you schedule for skin testing visit to see what you are allergic to at this point in time if considering restarting immunotherapy.  Remember to hold antihistamine for 3 days prior to this skin testing visit.  ?- Stop taking: Claritin ?- Start taking: Xyzal (levocetirizine) 5mg  tablet once daily.  This is a long-acting antihistamine that may be more effective than Claritin. Sample provided.  ?Ryaltris (olopatadine/mometasone) two sprays per nostril 1-2 times daily as needed for nasal congestion or drainage.  Sample provided. ?Pataday (olopatadine) one drop per eye daily as needed for itchy/watery eyes.   ?- You can use an extra dose of the antihistamine, if needed, for breakthrough symptoms.  ?- Consider nasal saline rinses 1-2 times daily to remove allergens from the nasal cavities as well as help with mucous clearance (this is especially helpful to do before the nasal sprays are given) ? ?Asthma ?- Continue Arnuity 200 - 1 inhalation 3-7 times a week depending on disease activity.  ?- Continue Proair Respiclick or albuterol nebulization as needed every 4 hours for cough/wheeze/shortness of breath/chest tightness. Monitor frequency of use.   ? ?Return for skin testing no sooner than 3 to 4 weeks from now ? ?I appreciate the opportunity to take part in Allen's care. Please do not hesitate to contact me with questions. ? ?Sincerely, ? ? ?Margo AyeShaylar  Jatasia Gundrum, MD ?Allergy/Immunology ?[facility] ? ? ?

## 2021-12-10 ENCOUNTER — Ambulatory Visit: Payer: Managed Care, Other (non HMO) | Admitting: Physical Therapy

## 2021-12-24 ENCOUNTER — Ambulatory Visit: Payer: Self-pay | Admitting: Allergy

## 2022-02-13 IMAGING — MG DIGITAL SCREENING BILAT W/ CAD
4 series · 4 of 4 positions shown · non-contrast
Comparison: Previous exam(s).

CLINICAL DATA: Screening.

EXAM:
DIGITAL SCREENING BILATERAL MAMMOGRAM WITH CAD

[R MLO]
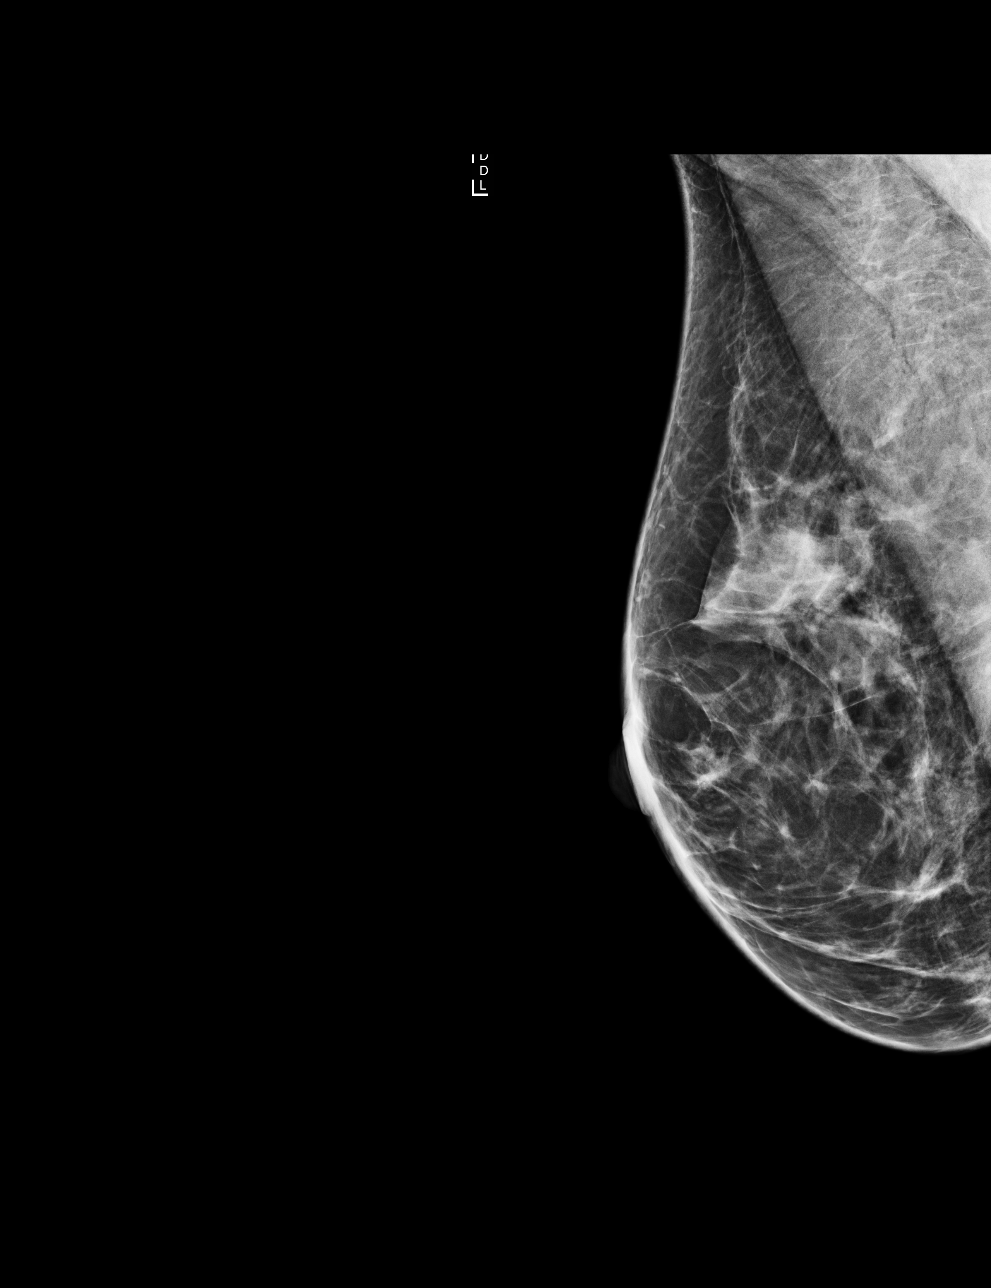

[L CC]
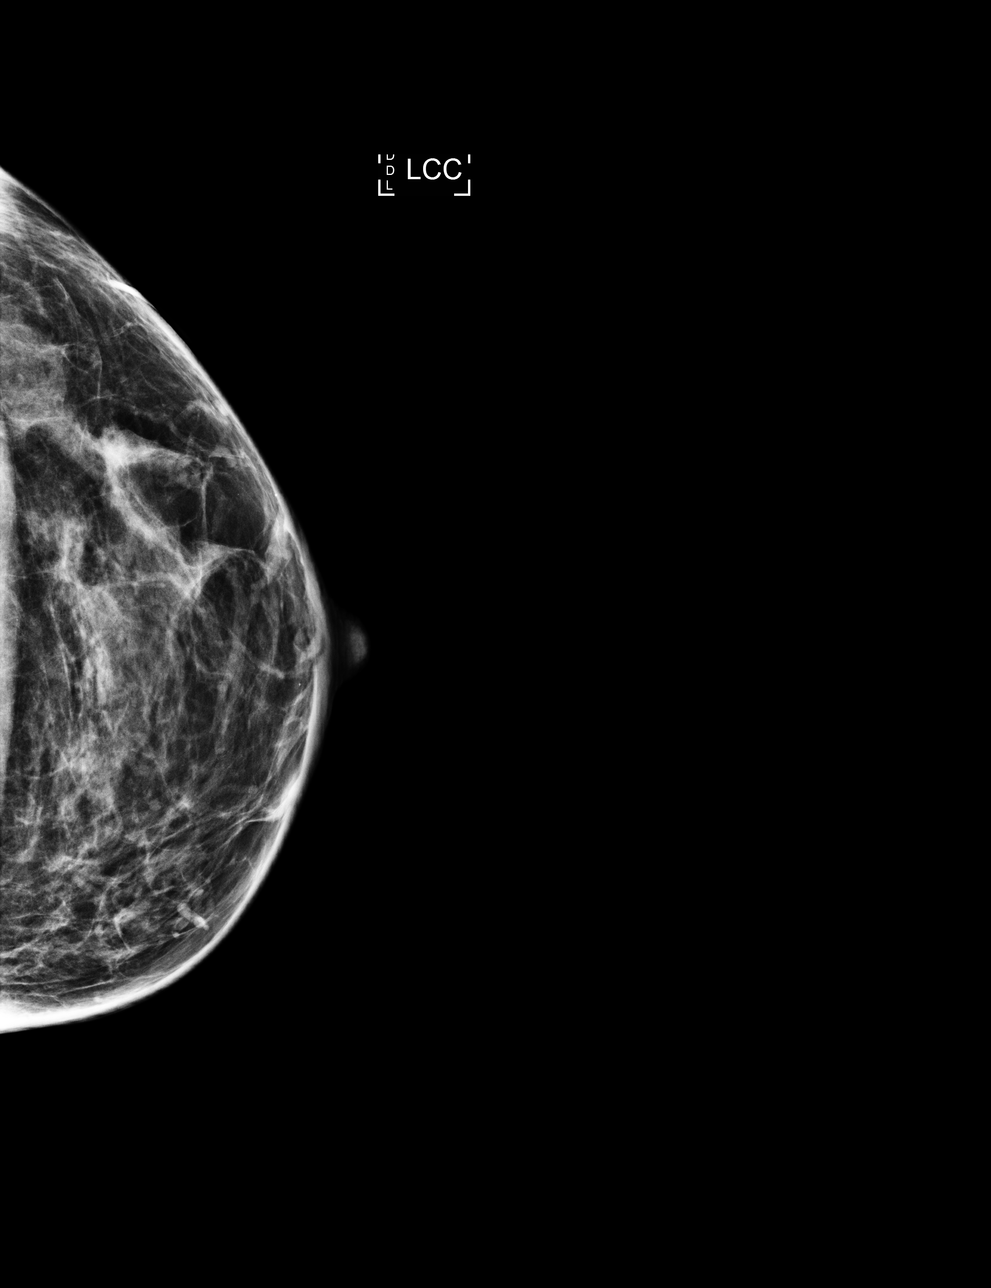

[R CC]
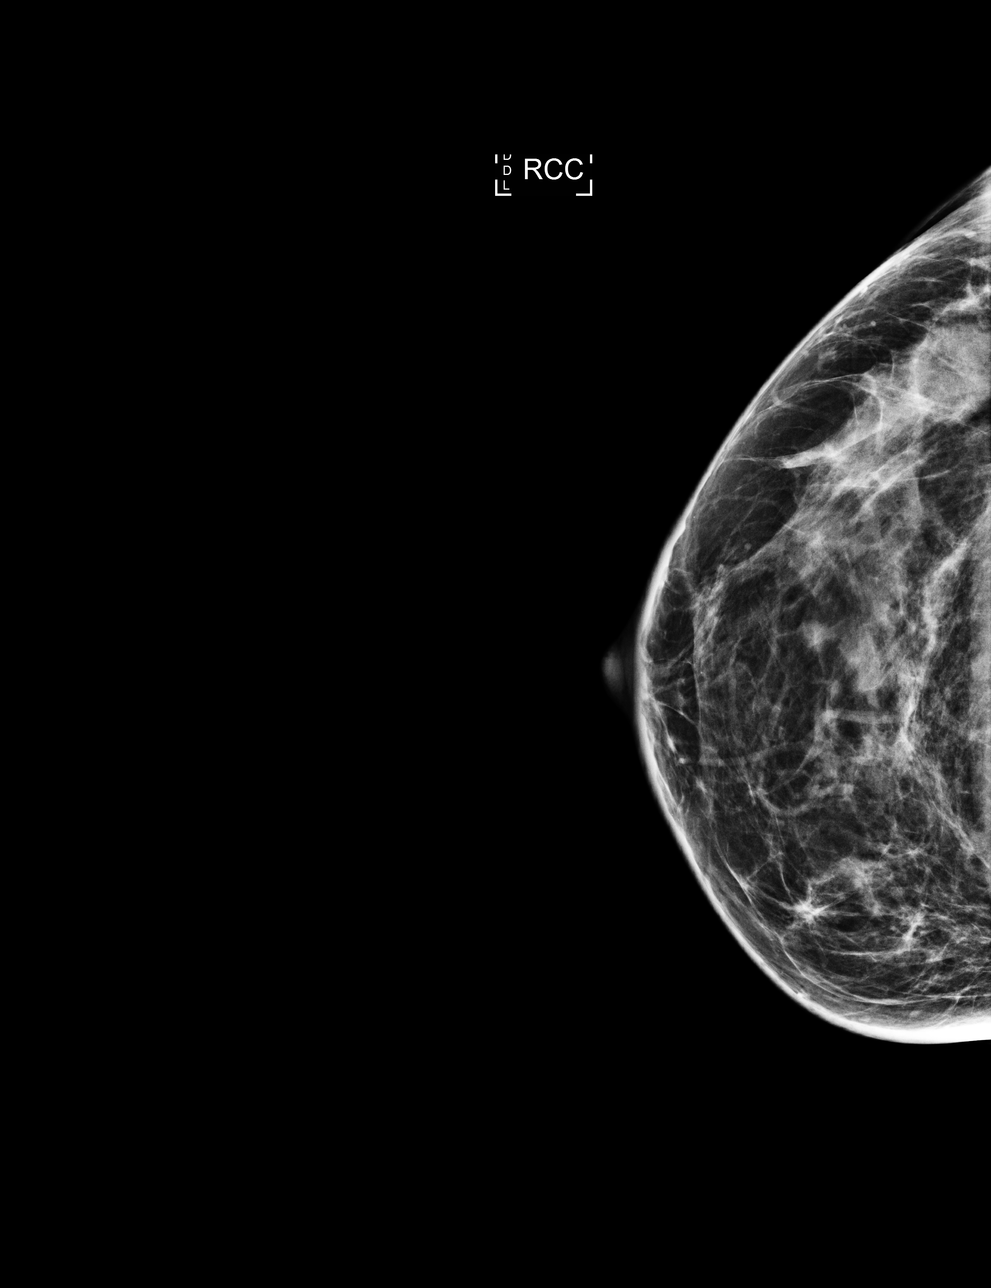

[L MLO]
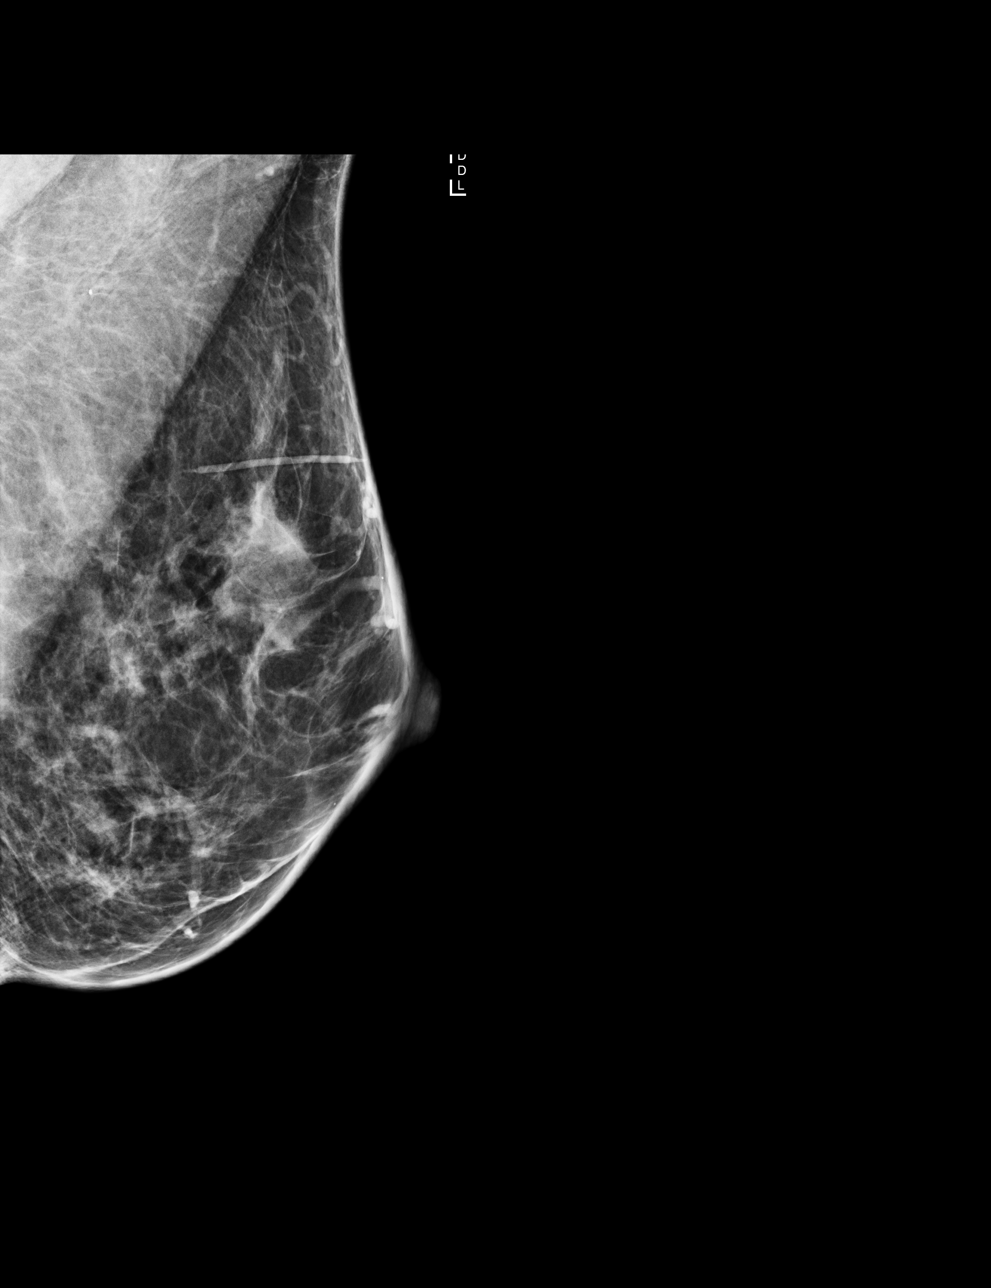

[4 of 4 positions shown; findings below may reference images not displayed]

ACR Breast Density Category c: The breast tissue is heterogeneously
dense, which may obscure small masses.
FINDINGS: There are no findings suspicious for malignancy. Images were
processed with CAD.
IMPRESSION: No mammographic evidence of malignancy. A result letter of this
screening mammogram will be mailed directly to the patient.

RECOMMENDATION:
Screening mammogram in one year. (Code:[id number])

BI-RADS CATEGORY  1: Negative.

## 2022-02-18 ENCOUNTER — Ambulatory Visit: Payer: Managed Care, Other (non HMO) | Admitting: Allergy

## 2022-02-18 ENCOUNTER — Encounter: Payer: Self-pay | Admitting: Allergy

## 2022-02-18 VITALS — BP 110/62 | HR 57 | Resp 16

## 2022-02-18 DIAGNOSIS — H1013 Acute atopic conjunctivitis, bilateral: Secondary | ICD-10-CM

## 2022-02-18 DIAGNOSIS — L253 Unspecified contact dermatitis due to other chemical products: Secondary | ICD-10-CM

## 2022-02-18 DIAGNOSIS — J4540 Moderate persistent asthma, uncomplicated: Secondary | ICD-10-CM | POA: Diagnosis not present

## 2022-02-18 DIAGNOSIS — J3089 Other allergic rhinitis: Secondary | ICD-10-CM

## 2022-02-18 DIAGNOSIS — L2489 Irritant contact dermatitis due to other agents: Secondary | ICD-10-CM

## 2022-02-18 MED ORDER — RYALTRIS 665-25 MCG/ACT NA SUSP: 2.0000 | Freq: Two times a day (BID) | NASAL | 5 refills | Status: DC

## 2022-02-18 MED ORDER — OPZELURA 1.5 % EX CREA
TOPICAL_CREAM | CUTANEOUS | 5 refills | Status: DC
Start: 2022-02-18 — End: 2024-02-04

## 2022-02-18 NOTE — Patient Instructions (Addendum)
Allergic rhinitis with conjunctivitis - Testing today is positive to tree pollen, molds, dust mite, cat - Continue Xyzal (levocetirizine) 5mg  tablet once daily. Ryaltris (olopatadine/mometasone) two sprays per nostril 1-2 times daily as needed for nasal congestion or drainage.  Sample provided. Pataday (olopatadine) one drop per eye daily as needed for itchy/watery eyes.   - You can use an extra dose of the antihistamine, if needed, for breakthrough symptoms.  - Consider nasal saline rinses 1-2 times daily to remove allergens from the nasal cavities as well as help with mucous clearance (this is especially helpful to do before the nasal sprays are given)  Asthma - Asthma action plan  (if having respiratory illness or asthma flare): Take Arnuity  1 inhalation daily for 1 to 2 weeks until symptoms have resolved - Use Proair Respiclick or albuterol nebulization as needed every 4 hours for cough/wheeze/shortness of breath/chest tightness. Monitor frequency of use.    Contact dermatitis - Continue to avoid your contact allergens including latex containing products - use opzelura ointment twice a day as needed to areas on face or body that is red, irritated, itchy, dry, patchy, flaky, scaly.  Sample provided.  Follow-up in 6 months or sooner if needed

## 2022-02-18 NOTE — Progress Notes (Signed)
Follow-up Note  RE: Barbara Jordan MRN: [id number] DOB: 1971-08-01 Date of Office Visit: 02/18/2022   History of present illness: Barbara Jordan is a 51 y.o. female presenting today for skin testing visit.  She was last seen in the office on 11/19/2021 at which time she was having a irritant contact dermatitis reaction.  This did resolve.  She states the Opzelura cream has been very helpful.  She has noted now when she is playing with the dogs that she can develop a rash that this cream does help with.  She is retesting today as she is interested in resuming allergen immunotherapy.  Her previous testing was prior to 2018.  She was positive at that time to oak, pecan, ragweed mix, dust mites.  These were the items in her immunotherapy.  Fortunately she has not had any issues with her breathing since the last visit.  She does not require albuterol use.  She is also not needing to take Arnuity inhaler which she does have at home.  She states she also has not had significant allergy symptoms this spring like everyone else has.  She has held antihistamines for at least the past 2 days for testing today.   Review of systems in the past 4 weeks: Review of Systems  Constitutional: Negative.   HENT: Negative.    Eyes: Negative.   Respiratory: Negative.    Cardiovascular: Negative.   Gastrointestinal: Negative.   Musculoskeletal: Negative.   Skin: Negative.   Allergic/Immunologic: Negative.   Neurological: Negative.      All other systems negative unless noted above in HPI  Past medical/social/surgical/family history have been reviewed and are unchanged unless specifically indicated below.  No changes  Medication List: Current Outpatient Medications  Medication Sig Dispense Refill   albuterol (PROVENTIL) (2.5 MG/3ML) 0.083% nebulizer solution Take 2.5 mg by nebulization every 4 (four) hours as needed for wheezing or shortness of breath.     Albuterol Sulfate (PROAIR  RESPICLICK) 108 (90 Base) MCG/ACT AEPB Inhale 2 Doses into the lungs every 4 (four) hours as needed. Inhale two puffs every four to six hours as needed for cough or wheeze. 1 each 0   ARMOUR THYROID 60 MG tablet Take 60 mg by mouth daily.  0   B Complex Vitamins (B COMPLEX-B12 PO) Take by mouth daily.     Cholecalciferol (VITAMIN D PO) Take by mouth.     Coenzyme Q10 (COQ10 PO) Take by mouth.     Fluticasone Furoate (ARNUITY ELLIPTA) 200 MCG/ACT AEPB 1 inhalation 3-7 times per week 30 each 5   KRILL OIL PO Take by mouth.     liothyronine (CYTOMEL) 5 MCG tablet   0   LYSINE PO Take by mouth. C-Lysine     MAGNESIUM CHLORIDE ER PO Take by mouth.     Misc Natural Products (ADRENAL PO) Take by mouth.     NALTREXONE HCL PO Take by mouth daily.     NASAL SALINE NA Place into the nose as needed.     Nutritional Supplements (DHEA PO) Take by mouth daily.     Olopatadine-Mometasone (RYALTRIS) [id number] MCG/ACT SUSP Place 2 sprays into the nose 2 (two) times daily. 29 g 5   Probiotic Product (PROBIOTIC DAILY PO) Take by mouth.     progesterone (PROMETRIUM) 100 MG capsule   0   TURMERIC PO Take by mouth.     No current facility-administered medications for this visit.     Known medication allergies:  Allergies  Allergen Reactions   Hydrocodone    Latex      Physical examination: Blood pressure 110/62, pulse (!) 57, resp. rate 16, SpO2 98 %.  General: Alert, interactive, in no acute distress. HEENT: PERRLA, TMs pearly gray, turbinates non-edematous without discharge, post-pharynx non erythematous. Neck: Supple without lymphadenopathy. Lungs: Clear to auscultation without wheezing, rhonchi or rales. {no increased work of breathing. CV: Normal S1, S2 without murmurs. Abdomen: Nondistended, nontender. Skin: Warm and dry, without lesions or rashes. Extremities:  No clubbing, cyanosis or edema. Neuro:   Grossly intact.  Diagnositics/Labs:  Allergy testing:   Airborne Adult Perc - 02/18/22  1346     Time Antigen Placed 1346    Allergen Manufacturer Waynette Buttery    Location Back    Number of Test 59    Panel 1 Select    1. Control-Buffer 50% Glycerol Negative    2. Control-Histamine 1 mg/ml 2+    3. Albumin saline Negative    4. Bahia Negative    5. French Southern Territories Negative    6. Johnson Negative    7. Kentucky Blue Negative    8. Meadow Fescue Negative    9. Perennial Rye Negative    10. Sweet Vernal Negative    11. Timothy Negative    12. Cocklebur Negative    13. Burweed Marshelder Negative    14. Ragweed, short Negative    15. Ragweed, Giant Negative    16. Plantain,  English Negative    17. Lamb's Quarters Negative    18. Sheep Sorrell Negative    19. Rough Pigweed Negative    20. Marsh Elder, Rough Negative    21. Mugwort, Common Negative    22. Ash mix Negative    23. Birch mix Negative    24. Beech American Negative    25. Box, Elder Negative    26. Cedar, red Negative    27. Cottonwood, Guinea-Bissau Negative    28. Elm mix Negative    29. Hickory 2+    30. Maple mix Negative    31. Oak, Guinea-Bissau mix 2+    32. Pecan Pollen 2+    33. Pine mix Negative    34. Sycamore Eastern Negative    35. Walnut, Black Pollen Negative    36. Alternaria alternata Negative    37. Cladosporium Herbarum Negative    38. Aspergillus mix Negative    39. Penicillium mix Negative    40. Bipolaris sorokiniana (Helminthosporium) Negative    41. Drechslera spicifera (Curvularia) Negative    42. Mucor plumbeus Negative    43. Fusarium moniliforme 2+    44. Aureobasidium pullulans (pullulara) Negative    45. Rhizopus oryzae Negative    46. Botrytis cinera Negative    47. Epicoccum nigrum Negative    48. Phoma betae Negative    49. Candida Albicans Negative    50. Trichophyton mentagrophytes Negative    51. Mite, D Farinae  5,000 AU/ml 2+    52. Mite, D Pteronyssinus  5,000 AU/ml Negative    53. Cat Hair 10,000 BAU/ml 2+    54.  Dog Epithelia Negative    55. Mixed Feathers Negative     56. Horse Epithelia Negative    57. Cockroach, German Negative    58. Mouse Negative    59. Tobacco Leaf Negative             Intradermal - 02/18/22 1433     Time Antigen Placed 1433    Allergen Manufacturer Waynette Buttery  Location Arm    Number of Test 11    Intradermal Select    Control Negative    French Southern Territories Negative    Johnson Negative    7 Grass Negative    Ragweed mix Negative    Weed mix Negative    Mold 1 Negative    Mold 2 2+    Mold 3 Negative    Dog Negative    Cockroach Negative             Allergy testing results were read and interpreted by provider, documented by clinical staff.   Assessment and plan:    Allergic rhinitis with conjunctivitis - Testing today is positive to tree pollen, molds, dust mite, cat - Continue Xyzal (levocetirizine) 5mg  tablet once daily. Ryaltris (olopatadine/mometasone) two sprays per nostril 1-2 times daily as needed for nasal congestion or drainage.  Sample provided. Pataday (olopatadine) one drop per eye daily as needed for itchy/watery eyes.   - You can use an extra dose of the antihistamine, if needed, for breakthrough symptoms.  - Consider nasal saline rinses 1-2 times daily to remove allergens from the nasal cavities as well as help with mucous clearance (this is especially helpful to do before the nasal sprays are given) - she will consider restarting allergen immunotherapy if medication management is not effective  Asthma - Asthma action plan  (if having respiratory illness or asthma flare): Take Arnuity  1 inhalation daily for 1 to 2 weeks until symptoms have resolved - Use Proair Respiclick or albuterol nebulization as needed every 4 hours for cough/wheeze/shortness of breath/chest tightness. Monitor frequency of use.    Contact dermatitis - Continue to avoid your contact allergens including latex containing products - use opzelura ointment twice a day as needed to areas on face or body that is red, irritated,  itchy, dry, patchy, flaky, scaly.  Sample provided.  Follow-up in 6 months or sooner if needed  I appreciate the opportunity to take part in Marine's care. Please do not hesitate to contact me with questions.  Sincerely,   , MD Allergy/Immunology [facility]

## 2022-07-23 ENCOUNTER — Telehealth: Payer: Self-pay

## 2022-07-23 NOTE — Telephone Encounter (Signed)
PA renewal request received via CMM for Opzelura 1.5% cream through [facility].   PA has been submitted and is awaiting determination.   KeyViviano Jordan - PA Case ID: [id number]

## 2022-08-01 MED ORDER — PIMECROLIMUS 1 % EX CREA: TOPICAL_CREAM | Freq: Two times a day (BID) | CUTANEOUS | 5 refills | Status: DC

## 2022-08-01 NOTE — Telephone Encounter (Signed)
Pa denied as pt has not tried and failed  tacrolimus  ointment, pimecrolimus cream and does not have a documented hx of atopic dermatitis

## 2022-08-01 NOTE — Telephone Encounter (Signed)
Elidel is preferred and will replace Opzelura. LVM for patient about medication change.

## 2022-08-01 NOTE — Telephone Encounter (Signed)
Patient Advocate Encounter  Received a fax from [place] regarding Prior Authorization for [place] 1.5% cream.   Authorization has been DENIED due to Based on the information provided, I am unable to approve coverage for this medication because: There is nothing to show that the individual has atopic dermatitis involvement estimated to affect  20% or less of the body surface area (BSA). Topical ruxolitinib (Opzelura) is considered medically necessary when the individual meets all of  the following for Mild to Moderate Atopic Dermatitis (A, B, C, D, and E): A. Individual is 53 years of  age or older; B. Individual has atopic dermatitis involvement estimated to affect 20% or less of the  body surface area (BSA); C. Documentation of one of the following (i or ii): i. Documented  inadequate response to one prescription topical corticosteroid (medium-potency or higher) applied  for at least 14 consecutive days, unless contraindicated or intolerant; or ii. Individual is treating  atopic dermatitis affecting one of the following areas: face, skin folds, and/or genitalia; D.  Documented inadequate response to one topical calcineurin inhibitor (for example, tacrolimus  ointment, pimecrolimus cream) applied for 6 consecutive weeks, unless contraindicated or  intolerant; and E. Medication is being prescribed by, or in consultation with, an allergist,  immunologist, or dermatologist. Concomitant use of a topical calcineurin inhibitor with a topical  corticosteroid would meet requirements [C] and [D]. Topical ruxolitinib (Opzelura) is considered  medically necessary for continued use when initial criteria are met and there is documentation of  beneficial response. Examples of beneficial response to therapy include: improvement in estimated  body surface area affected, erythema, induration/papulation/edema, excoriations, lichenification.  Any other use is considered experimental, investigational or unproven,  including the following (this list may not be all inclusive): 1. Concurrent Use with a Biologic or with other JAK inhibitors; 2.  Concurrent use with Potent Immunosuppressants (for example, azathioprine, cyclosporine); 3.  Alopecia; and/or 4. Plaque Psoriasis.

## 2022-09-17 ENCOUNTER — Ambulatory Visit (INDEPENDENT_AMBULATORY_CARE_PROVIDER_SITE_OTHER): Payer: Managed Care, Other (non HMO)

## 2022-09-17 ENCOUNTER — Ambulatory Visit: Payer: Managed Care, Other (non HMO) | Admitting: Podiatry

## 2022-09-17 ENCOUNTER — Encounter: Payer: Self-pay | Admitting: Podiatry

## 2022-09-17 DIAGNOSIS — M722 Plantar fascial fibromatosis: Secondary | ICD-10-CM

## 2022-09-17 DIAGNOSIS — M779 Enthesopathy, unspecified: Secondary | ICD-10-CM

## 2022-09-18 NOTE — Progress Notes (Signed)
Subjective:   Patient ID: Barbara Jordan, female   DOB: 52 y.o.   MRN: [id number]   HPI Patient presents stating she is very active and does also CrossFit and had a severe bout of Planter fasciitis that lasted for almost 2 years and has been able to work through that with a previous pair of orthotics but now is experiencing mild discomfort outside of left foot.  Patient does not smoke very active   Review of Systems  All other systems reviewed and are negative.       Objective:  Physical Exam Vitals and nursing note reviewed.  Constitutional:      Appearance: She is well-developed.  Pulmonary:     Effort: Pulmonary effort is normal.  Musculoskeletal:        General: Normal range of motion.  Skin:    General: Skin is warm.  Neurological:     Mental Status: She is alert.     Neurovascular status found to be intact muscle strength was found to be adequate range of motion adequate.  Mild discomfort around the peroneal insertion left foot with moderate cavus foot structure bilateral and inflammation pain of the low-grade nature in the heel arch area not significant.  Patient has good digital perfusion well-oriented x 3     Assessment:  Very active individual cavus foot structure history of severe plantar fascial inflammation with mild peroneal tendinitis which may be compensatory     Plan:  H&P reviewed all conditions for this.  At this point do not recommend aggressive treatment for acute inflammation as it is not significant did recommend orthotics and patient is casted for functional orthotic devices.  Patient will be seen back when returned with all questions answered today  X-rays indicate plantar spur formation of a significant nature heel with moderate cavus foot structure

## 2022-10-07 ENCOUNTER — Telehealth: Payer: Self-pay | Admitting: Allergy

## 2022-10-07 MED ORDER — ARNUITY ELLIPTA 200 MCG/ACT IN AEPB: INHALATION_SPRAY | RESPIRATORY_TRACT | 5 refills | Status: DC

## 2022-10-07 NOTE — Telephone Encounter (Signed)
Patient is requesting a refill for Arnuity sent to [facility] in [place].

## 2022-10-07 NOTE — Telephone Encounter (Signed)
Refill sent in to requested pharmacy

## 2022-10-10 ENCOUNTER — Telehealth: Payer: Self-pay

## 2022-10-10 NOTE — Telephone Encounter (Signed)
PA request received via CMM for Arnuity Ellipta 200MCG/ACT aerosol powder  PA has been submitted to [facility] and is pending determination.   [id number]

## 2022-10-15 ENCOUNTER — Telehealth: Payer: Self-pay | Admitting: Allergy

## 2022-10-15 NOTE — Telephone Encounter (Signed)
Patient is aware that there is a PA pending for Arnuity but she wanted to know if there was a possible alternative in case her INS did not cover.

## 2022-10-17 ENCOUNTER — Other Ambulatory Visit (HOSPITAL_COMMUNITY): Payer: Self-pay

## 2022-10-21 NOTE — Telephone Encounter (Signed)
Patient Advocate Encounter  Received a fax from [employer] regarding Prior Authorization for Arnuity Ellipta 200MCG/ACT aerosol powder.   Authorization has been DENIED due to      Full determination letter is attached to patient's chart. This also includes information needed for an appeal.   Please take note that we do not have a pharmacist to review denials. Therefore, all appeals will needed to be handled by the office accordingly, as needed. Thank you for your support during this time.

## 2022-10-22 NOTE — Telephone Encounter (Signed)
Arnuity was denied per PA Team.  Note sent to Dr. Neldon Mc.  Awaiting decision about new RX.

## 2022-10-22 NOTE — Telephone Encounter (Signed)
Left message to call the office.

## 2022-10-23 MED ORDER — ASMANEX (60 METERED DOSES) 220 MCG/ACT IN AEPB: INHALATION_SPRAY | RESPIRATORY_TRACT | 1 refills | Status: DC

## 2022-10-23 NOTE — Telephone Encounter (Signed)
Patient informed and [place] sent to [facility].

## 2022-10-23 NOTE — Telephone Encounter (Signed)
Patient informed and [place] sent to [facility]

## 2022-10-23 NOTE — Telephone Encounter (Signed)
Per patient's insurance - [employer] - possible alternatives:  Asmanex Twisthaler, Mometasone (Asmanex HFA), Beclomethasone (Qvar Redihaler), Ciclesonide (Alvesco).  Forwarding message to provider and [place] clinical pool.

## 2022-11-05 ENCOUNTER — Ambulatory Visit (INDEPENDENT_AMBULATORY_CARE_PROVIDER_SITE_OTHER): Payer: Managed Care, Other (non HMO) | Admitting: Podiatry

## 2022-11-05 DIAGNOSIS — M722 Plantar fascial fibromatosis: Secondary | ICD-10-CM

## 2022-11-19 ENCOUNTER — Ambulatory Visit (INDEPENDENT_AMBULATORY_CARE_PROVIDER_SITE_OTHER): Payer: Managed Care, Other (non HMO) | Admitting: Allergy and Immunology

## 2022-11-19 ENCOUNTER — Encounter: Payer: Self-pay | Admitting: Allergy and Immunology

## 2022-11-19 VITALS — BP 112/78 | HR 62 | Resp 16

## 2022-11-19 DIAGNOSIS — L2489 Irritant contact dermatitis due to other agents: Secondary | ICD-10-CM

## 2022-11-19 DIAGNOSIS — J3089 Other allergic rhinitis: Secondary | ICD-10-CM | POA: Diagnosis not present

## 2022-11-19 DIAGNOSIS — J4540 Moderate persistent asthma, uncomplicated: Secondary | ICD-10-CM | POA: Diagnosis not present

## 2022-11-19 MED ORDER — AIRSUPRA 90-80 MCG/ACT IN AERO: 2.0000 | INHALATION_SPRAY | RESPIRATORY_TRACT | 1 refills | Status: DC | PRN

## 2022-11-19 NOTE — Progress Notes (Unsigned)
[place]   Follow-up Note  Referring Provider: Ernestene Kiel, MD Primary Provider: Rolanda Jay, MD Date of Office Visit: 11/19/2022  Subjective:   Barbara Jordan (DOB: April 14, 1971) is a 52 y.o. female who returns to the [facility] [place] on 11/19/2022 in re-evaluation of the following:  HPI: Barbara Jordan returns to this clinic in evaluation of asthma and allergic rhinitis.  I have not seen her in this clinic since 15 July 2021.  She did have visit with Dr. Nelva Bush 18 February 2022.  She is really doing very well at this point in time while using low-dose Asmanex.  She rarely has any problems with her asthma can exercise without difficulty and rarely uses a short acting bronchodilator and has not required a systemic steroid to treat exacerbation.  Her upper airway disease is under very good control while using Astepro.  She has not required antibiotic to treat an episode of sinusitis.  She has some hand dermatitis and was given Opzulera by Dr. Nelva Bush which has worked wonderful.  She rarely uses this agent at this point.  Allergies as of 11/19/2022       Reactions   Hydrocodone    Latex         Medication List    ADRENAL PO Take by mouth.   albuterol (2.5 MG/3ML) 0.083% nebulizer solution Commonly known as: PROVENTIL Take 2.5 mg by nebulization every 4 (four) hours as needed for wheezing or shortness of breath.   Albuterol Sulfate 108 (90 Base) MCG/ACT Aepb Commonly known as: ProAir RespiClick Inhale 2 Doses into the lungs every 4 (four) hours as needed. Inhale two puffs every four to six hours as needed for cough or wheeze.   [place]our Thyroid 60 MG tablet Generic drug: thyroid Take 60 mg by mouth daily.   Asmanex (60 Metered Doses) 220 MCG/ACT inhaler Generic drug: mometasone Inhale one dose one to two times per day to prevent cough or wheeze.  Rinse, gargle, and spit after use.   B  COMPLEX-B12 PO Take by mouth daily.   COQ10 PO Take by mouth.   DHEA PO Take by mouth daily.   KRILL OIL PO Take by mouth.   liothyronine 5 MCG tablet Commonly known as: CYTOMEL   LYSINE PO Take by mouth. C-Lysine   MAGNESIUM CHLORIDE ER PO Take by mouth.   NALTREXONE HCL PO Take by mouth daily.   NASAL SALINE NA Place into the nose as needed.   Opzelura 1.5 % Crea Generic drug: Ruxolitinib Phosphate Use twice a day as needed   PROBIOTIC DAILY PO Take by mouth.   progesterone 100 MG capsule Commonly known as: PROMETRIUM   TURMERIC PO Take by mouth.   VITAMIN D PO Take by mouth.    Past Medical History:  Diagnosis Date   Allergic rhinoconjunctivitis    Asthma    Hypothyroidism     Past Surgical History:  Procedure Laterality Date   biospy     Fibrocystic breast disease with biopsy   BREAST EXCISIONAL BIOPSY Left    BREAST REDUCTION SURGERY     CESAREAN SECTION     REDUCTION MAMMAPLASTY Bilateral    about 8 years ago    Review of systems negative except as noted in HPI / PMHx or noted below:  Review of Systems  Constitutional: Negative.   HENT: Negative.    Eyes: Negative.   Respiratory: Negative.    Cardiovascular: Negative.  Gastrointestinal: Negative.   Genitourinary: Negative.   Musculoskeletal: Negative.   Skin: Negative.   Neurological: Negative.   Endo/Heme/Allergies: Negative.   Psychiatric/Behavioral: Negative.       Objective:   Vitals:   11/19/22 1528  BP: 112/78  Jordan: 62  Resp: 16  SpO2: 97%          Physical Exam Constitutional:      Appearance: She is not diaphoretic.  HENT:     Head: Normocephalic.     Right Ear: Tympanic membrane, ear canal and external ear normal.     Left Ear: Tympanic membrane, ear canal and external ear normal.     Nose: Nose normal. No mucosal edema or rhinorrhea.     Mouth/Throat:     Pharynx: Uvula midline. No oropharyngeal exudate.  Eyes:     Conjunctiva/sclera:  Conjunctivae normal.  Neck:     Thyroid: No thyromegaly.     Trachea: Trachea normal. No tracheal tenderness or tracheal deviation.  Cardiovascular:     Rate and Rhythm: Normal rate and regular rhythm.     Heart sounds: Normal heart sounds, S1 normal and S2 normal. No murmur heard. Pulmonary:     Effort: No respiratory distress.     Breath sounds: Normal breath sounds. No stridor. No wheezing or rales.  Lymphadenopathy:     Head:     Right side of head: No tonsillar adenopathy.     Left side of head: No tonsillar adenopathy.     Cervical: No cervical adenopathy.  Skin:    Findings: No erythema or rash.     Nails: There is no clubbing.  Neurological:     Mental Status: She is alert.     Diagnostics: none  Assessment and Plan:   1. Asthma, moderate persistent, well-controlled   2. Perennial allergic rhinitis   3. Irritant contact dermatitis due to other agents    1. Continue Asmanex 220 twisthaler -  1 inhalation 1-2 times per day   2. If needed:  A. Astepro  B. AirSupra - 2 inhalations every 4-6 hours (Coupon) C. Albuterol nebulization  D. Antihistamine  E. Opzulera  3. Return to clinic in 1 year or earlier if problem  Barbara Jordan appears to be doing very well and she has a very good understanding about her disease state and how her medications work and appropriate dosing of her medications depending on disease activity.  As long as she continues to do well with the plan noted above we will see her back in this clinic in 1 year or earlier if there is a problem.    Allena Katz, MD Allergy / Immunology [place]

## 2022-11-19 NOTE — Patient Instructions (Addendum)
  1. Continue Asmanex 220 twisthaler -  1 inhalation 1-2 times per day   2. If needed:  A. Astepro  B. AirSupra - 2 inhalations every 4-6 hours (Coupon) C. Albuterol nebulization  D. Antihistamine  E. Opzulera  3. Return to clinic in 1 year or earlier if problem

## 2022-11-20 ENCOUNTER — Encounter: Payer: Self-pay | Admitting: Allergy and Immunology

## 2022-11-21 ENCOUNTER — Telehealth: Payer: Self-pay | Admitting: Podiatry

## 2022-11-21 NOTE — Telephone Encounter (Signed)
Pt called upset that she has left 3 messages for the orthotic dept and she has not heard back. She is frustrated that the orthotics she came to pick up on 3.6 were not correct. I did apologize and explained that when orthotics are not correct they send them back to the facility and it can take a few weeks.   She also was upset she keeps getting a bill threatening to turn her over to collections and she states she has paid that bill already.  PT talked with billing and everything is clear bill was sent day before we received the payment.

## 2022-11-24 NOTE — Telephone Encounter (Signed)
Left message on vm that patient orthotics came in the mail today.

## 2022-12-02 DIAGNOSIS — M722 Plantar fascial fibromatosis: Secondary | ICD-10-CM

## 2022-12-16 ENCOUNTER — Telehealth: Payer: Self-pay | Admitting: Allergy and Immunology

## 2022-12-16 NOTE — Telephone Encounter (Signed)
Patient states Ryaltris is working better than Astepro. She's wanting to know if we are able to send in Ryaltris.

## 2022-12-17 MED ORDER — RYALTRIS 665-25 MCG/ACT NA SUSP: NASAL | 10 refills | Status: AC

## 2022-12-17 NOTE — Telephone Encounter (Signed)
Called and informed patient of Dr. Kathyrn Lass message.  ERX sent to [facility].

## 2022-12-31 ENCOUNTER — Encounter: Payer: Self-pay | Admitting: *Deleted

## 2023-06-02 NOTE — Progress Notes (Signed)
Seen by casting department

## 2024-01-11 ENCOUNTER — Other Ambulatory Visit: Payer: Self-pay | Admitting: *Deleted

## 2024-01-11 MED ORDER — AIRSUPRA 90-80 MCG/ACT IN AERO: 2.0000 | INHALATION_SPRAY | RESPIRATORY_TRACT | 0 refills | Status: DC | PRN

## 2024-01-11 MED ORDER — ASMANEX (60 METERED DOSES) 220 MCG/ACT IN AEPB: INHALATION_SPRAY | RESPIRATORY_TRACT | 0 refills | Status: DC

## 2024-01-13 ENCOUNTER — Ambulatory Visit: Admitting: Allergy and Immunology

## 2024-01-13 ENCOUNTER — Encounter: Payer: Self-pay | Admitting: Allergy and Immunology

## 2024-01-13 VITALS — BP 128/78 | HR 60 | Resp 16 | Ht 65.5 in | Wt 142.4 lb

## 2024-01-13 DIAGNOSIS — J0140 Acute pansinusitis, unspecified: Secondary | ICD-10-CM

## 2024-01-13 DIAGNOSIS — J4540 Moderate persistent asthma, uncomplicated: Secondary | ICD-10-CM

## 2024-01-13 DIAGNOSIS — J3089 Other allergic rhinitis: Secondary | ICD-10-CM

## 2024-01-13 MED ORDER — AMOXICILLIN-POT CLAVULANATE 875-125 MG PO TABS: ORAL_TABLET | ORAL | 0 refills | Status: DC

## 2024-01-13 MED ORDER — AZITHROMYCIN 500 MG PO TABS
ORAL_TABLET | ORAL | 0 refills | Status: DC
Start: 2024-01-13 — End: 2024-07-13

## 2024-01-13 MED ORDER — METHYLPREDNISOLONE ACETATE 80 MG/ML IJ SUSP
80.0000 mg | Freq: Once | INTRAMUSCULAR | Status: AC
  Administered 2024-01-13: 80 mg via INTRAMUSCULAR

## 2024-01-13 NOTE — Progress Notes (Unsigned)
 [place] - [place] - [place] - [place] - [place]   Follow-up Note  Referring Provider: Brendon Caller* Primary Provider: Judge Notice, MD Date of Office Visit: 01/13/2024  Subjective:   Barbara Jordan (DOB: 1971-07-04) is a 53 y.o. female who [facility] on 01/13/2024 in re-evaluation of the following:  HPI: Barbara Jordan presents to this clinic in evaluation of asthma and allergic rhinitis.  I last saw her in this clinic 19 November 2022.  She was doing wonderful without any significant issues revolving around her respiratory tract and was using her Asmanex  and her Ryaltris  as needed and rarely had to use the short acting bronchodilator could exercise without any problem.  However, about 3 weeks ago if not 4 weeks she developed acute onset of sore throat and head congestion and wheezing and coughing and some of the material that she was coughing up was yellow.  She went to the urgent care center and she was given amoxicillin  but no clavulanic acid and a short course of prednisone  and she actually did a lot better but unfortunately after discontinuing those agents she relapsed.  She now has left throat soreness, left ear discomfort, wheezing, and some slight yellow sputum.  She does not have any nasal congestion or anosmia or headache and she does not have any chest pain and she has no fever or associated systemic or constitutional symptoms.  Allergies as of 01/13/2024       Reactions   Hydrocodone    Latex         Medication List    ADRENAL PO Take by mouth.   Airsupra  90-80 MCG/ACT Aero Generic drug: Albuterol -Budesonide Inhale 2 puffs into the lungs every 4 (four) hours as needed.   albuterol  (2.5 MG/3ML) 0.083% nebulizer solution Commonly known as: PROVENTIL  Take 2.5 mg by nebulization every 4 (four) hours as needed for wheezing or shortness of breath.   Asmanex  (60 Metered Doses) 220 MCG/ACT inhaler Generic  drug: mometasone  Inhale one dose one to two times per day to prevent cough or wheeze.  Rinse, gargle, and spit after use.   B COMPLEX-B12 PO Take by mouth daily.   COQ10 PO Take by mouth.   DHEA PO Take by mouth daily.   KRILL OIL PO Take by mouth.   liothyronine 5 MCG tablet Commonly known as: CYTOMEL   LYSINE PO Take by mouth. C-Lysine   MAGNESIUM CHLORIDE ER PO Take by mouth.   NALTREXONE HCL PO Take by mouth daily.   NASAL SALINE NA Place into the nose as needed.   NP Thyroid 120 MG tablet Generic drug: thyroid Take 120 mg by mouth daily.   Opzelura  1.5 % Crea Generic drug: Ruxolitinib Phosphate  Use twice a day as needed   PROBIOTIC DAILY PO Take by mouth.   progesterone 100 MG capsule Commonly known as: PROMETRIUM   Ryaltris  665-25 MCG/ACT Susp Generic drug: Olopatadine-Mometasone  Use two sprays in each nostril one to two times daily.   VITAMIN D PO Take by mouth.    Past Medical History:  Diagnosis Date   Allergic rhinoconjunctivitis    Asthma    Hypothyroidism     Past Surgical History:  Procedure Laterality Date   biospy     Fibrocystic breast disease with biopsy   BREAST EXCISIONAL BIOPSY Left    BREAST REDUCTION SURGERY     CESAREAN SECTION     REDUCTION MAMMAPLASTY Bilateral    about 8 years ago  Review of systems negative except as noted in HPI / PMHx or noted below:  Review of Systems  Constitutional: Negative.   HENT: Negative.    Eyes: Negative.   Respiratory: Negative.    Cardiovascular: Negative.   Gastrointestinal: Negative.   Genitourinary: Negative.   Musculoskeletal: Negative.   Skin: Negative.   Neurological: Negative.   Endo/Heme/Allergies: Negative.   Psychiatric/Behavioral: Negative.       Objective:   Vitals:   01/13/24 1346  BP: 128/78  Pulse: 60  Resp: 16  SpO2: 97%   Height: 5' 5.5" (166.4 cm)  Weight: 142 lb 6.4 oz (64.6 kg)   Physical Exam Constitutional:      Appearance: She is not  diaphoretic.  HENT:     Head: Normocephalic.     Right Ear: Tympanic membrane, ear canal and external ear normal.     Left Ear: Tympanic membrane, ear canal and external ear normal.     Nose: Nose normal. No mucosal edema or rhinorrhea.     Mouth/Throat:     Pharynx: Uvula midline. No oropharyngeal exudate.  Eyes:     Conjunctiva/sclera: Conjunctivae normal.  Neck:     Thyroid: No thyromegaly.     Trachea: Trachea normal. No tracheal tenderness or tracheal deviation.  Cardiovascular:     Rate and Rhythm: Normal rate and regular rhythm.     Heart sounds: Normal heart sounds, S1 normal and S2 normal. No murmur heard. Pulmonary:     Effort: No respiratory distress.     Breath sounds: Normal breath sounds. No stridor. No wheezing or rales.  Lymphadenopathy:     Head:     Right side of head: No tonsillar adenopathy.     Left side of head: No tonsillar adenopathy.     Cervical: No cervical adenopathy.  Skin:    Findings: No erythema or rash.     Nails: There is no clubbing.  Neurological:     Mental Status: She is alert.     Diagnostics: Spirometry was performed and demonstrated an FEV1 of 1.97 at 71 % of predicted.  Assessment and Plan:   1. Not well controlled moderate persistent asthma   2. Perennial allergic rhinitis   3. Acute non-recurrent pansinusitis    1. Continue to treat and prevent inflammation of airway:  A. Asmanex  220 twisthaler -  1 inhalation 1-2 times per day  B. Ryaltris  - 2 sprays each nostril 1-2 times per day  2. If needed:  A. Astepro  B. AirSupra  - 2 inhalations every 4-6 hours  C. Albuterol  nebulization  D. Antihistamine   3. For this prolonged airway infection:   A. Augmentin  875 - 1 tablet 2 times per day for 14 days  B. Azithromycin  500 - 1 tablet 1 time per day for 3 days  C. Depomedrol 80 IM delivered in clinic today  4. Return to clinic in 1 year or earlier if problem  5. Influenza = Tamiflu. Covid = Paxlovid  Barbara Jordan has developed  a persistent respiratory tract infection and we are going to treat her with broad-spectrum antibiotics using Augmentin  plus covering her for mycoplasma with azithromycin  and I given her little bit more systemic steroid.  She was doing wonderful until she sustained this infection and hopefully she will go back to her pre-existing state which was very good and did not require consistent use of either Asmanex  or Ryaltris .  Assuming she does well I will see her back in this clinic in 1 year or  earlier if there is a problem.    Schuyler Custard, MD Allergy  / Immunology [facility]

## 2024-01-13 NOTE — Patient Instructions (Addendum)
  1. Continue to treat and prevent inflammation of airway:  A. Asmanex  220 twisthaler -  1 inhalation 1-2 times per day  B. Ryaltris  - 2 sprays each nostril 1-2 times per day  2. If needed:  A. Astepro  B. AirSupra  - 2 inhalations every 4-6 hours  C. Albuterol  nebulization  D. Antihistamine   3. For this prolonged airway infection:   A. Augmentin  875 - 1 tablet 2 times per day for 14 days  B. Azithromycin  500 - 1 tablet 1 time per day for 3 days  C. Depomedrol 80 IM delivered in clinic today  4. Return to clinic in 1 year or earlier if problem  5. Influenza = Tamiflu. Covid = Paxlovid

## 2024-01-14 ENCOUNTER — Encounter: Payer: Self-pay | Admitting: Allergy and Immunology

## 2024-02-04 ENCOUNTER — Other Ambulatory Visit: Payer: Self-pay | Admitting: *Deleted

## 2024-02-04 MED ORDER — OPZELURA 1.5 % EX CREA: TOPICAL_CREAM | CUTANEOUS | 5 refills | Status: DC

## 2024-02-05 ENCOUNTER — Other Ambulatory Visit (HOSPITAL_COMMUNITY): Payer: Self-pay

## 2024-02-05 ENCOUNTER — Telehealth: Payer: Self-pay

## 2024-02-05 DIAGNOSIS — L209 Atopic dermatitis, unspecified: Secondary | ICD-10-CM

## 2024-02-05 NOTE — Telephone Encounter (Signed)
 Would you like to send covered alternative? The Tacrolimus.

## 2024-02-05 NOTE — Telephone Encounter (Signed)
*  Asthma/Allergy   Pharmacy Patient Advocate Encounter   Received notification from CoverMyMeds that prior authorization for Opzelura  1.5% cream  is required/requested.   Insurance verification completed.   The patient is insured through [employer] .   Per test claim:  Tacrolimus ointment, Pimecrolimus   is preferred by the insurance.  If suggested medication is appropriate, Please send in a new RX and discontinue this one. If not, please advise as to why it's not appropriate so that we may request a Prior Authorization. Please note, some preferred medications may still require a PA.  If the suggested medications have not been trialed and there are no contraindications to their use, the PA will not be submitted, as it will not be approved.   CMM Key: [id number]

## 2024-02-08 MED ORDER — TACROLIMUS 0.1 % EX OINT: TOPICAL_OINTMENT | Freq: Two times a day (BID) | CUTANEOUS | 3 refills | Status: DC

## 2024-02-08 NOTE — Addendum Note (Signed)
 Addended by: Angelena Barber D on: 02/08/2024 09:36 AM   Modules accepted: Orders

## 2024-02-25 ENCOUNTER — Ambulatory Visit: Admitting: Allergy and Immunology

## 2024-04-20 ENCOUNTER — Other Ambulatory Visit: Payer: Self-pay | Admitting: Allergy and Immunology

## 2024-04-21 ENCOUNTER — Other Ambulatory Visit: Payer: Self-pay | Admitting: Allergy and Immunology

## 2024-07-13 ENCOUNTER — Encounter: Payer: Self-pay | Admitting: Allergy and Immunology

## 2024-07-13 ENCOUNTER — Ambulatory Visit: Admitting: Allergy and Immunology

## 2024-07-13 VITALS — BP 116/62 | HR 68 | Resp 12

## 2024-07-13 DIAGNOSIS — J3089 Other allergic rhinitis: Secondary | ICD-10-CM | POA: Diagnosis not present

## 2024-07-13 DIAGNOSIS — B9789 Other viral agents as the cause of diseases classified elsewhere: Secondary | ICD-10-CM | POA: Diagnosis not present

## 2024-07-13 DIAGNOSIS — J988 Other specified respiratory disorders: Secondary | ICD-10-CM | POA: Diagnosis not present

## 2024-07-13 DIAGNOSIS — J4541 Moderate persistent asthma with (acute) exacerbation: Secondary | ICD-10-CM | POA: Diagnosis not present

## 2024-07-13 MED ORDER — ASMANEX (60 METERED DOSES) 220 MCG/ACT IN AEPB: INHALATION_SPRAY | RESPIRATORY_TRACT | 5 refills | Status: AC

## 2024-07-13 MED ORDER — PREDNISONE 10 MG PO TABS: ORAL_TABLET | ORAL | 0 refills | Status: AC

## 2024-07-13 MED ORDER — AIRSUPRA 90-80 MCG/ACT IN AERO: 2.0000 | INHALATION_SPRAY | RESPIRATORY_TRACT | 1 refills | Status: AC | PRN

## 2024-07-13 NOTE — Progress Notes (Signed)
 [place] - [place] - [place] - [place] - [place]   Follow-up Note  Referring Provider: No ref. provider found Primary Provider: Trudy Dorn Lunger, MD (Inactive) Date of Office Visit: 07/13/2024  Subjective:   Barbara Jordan (DOB: [birth year]) is a 53 y.o. female who [facility] on 07/13/2024 in re-evaluation of the following:  HPI: Nathanel returns to this clinic in evaluation of asthma and allergic rhinitis.  I last saw her in this clinic 13 Jan 2024.  She was really doing very well since her last visit and had tapered off all of her medications and could exercise without difficulty and rarely used the short acting bronchodilator and did not use any nasal steroids and did not require systemic steroid or an antibiotic for any type of airway issue.  3 days ago she had acute onset of nasal congestion and some sore throat and some wheezing and shortness of breath and some cough without any fever or anosmia or ugly nasal discharge or chest pain or sputum production.  She did not check herself for COVID but her previous experience with COVID infection was to develop a head cold.  She unfortunately did not have any of her medications available to restart.  Allergies as of 07/13/2024       Reactions   Hydrocodone    Latex         Medication List    ADRENAL PO Take by mouth.   albuterol  (2.5 MG/3ML) 0.083% nebulizer solution Commonly known as: PROVENTIL  Take 2.5 mg by nebulization every 4 (four) hours as needed for wheezing or shortness of breath.   Asmanex  (60 Metered Doses) 220 MCG/ACT inhaler Generic drug: mometasone  USE 1 INHALATION 1 TO 2 TIMES PER DAY TO PREVENT COUGH/WHEEZE. RINSE, GARGLE,& SPIT AFTER USE.   B COMPLEX-B12 PO Take by mouth daily.   COQ10 PO Take by mouth.   DHEA PO Take by mouth daily.   KRILL OIL PO Take by mouth.   liothyronine 5 MCG tablet Commonly known as: CYTOMEL   LYSINE PO Take by  mouth. C-Lysine   MAGNESIUM CHLORIDE ER PO Take by mouth.   NALTREXONE HCL PO Take by mouth daily.   NASAL SALINE NA Place into the nose as needed.   NP Thyroid 120 MG tablet Generic drug: thyroid Take 120 mg by mouth daily.   PROBIOTIC DAILY PO Take by mouth.   progesterone 100 MG capsule Commonly known as: PROMETRIUM   Ryaltris  665-25 MCG/ACT Susp Generic drug: Olopatadine-Mometasone  Use two sprays in each nostril one to two times daily.   TRIAMCINOLONE ACETONIDE EX Apply topically.    Past Medical History:  Diagnosis Date   Allergic rhinoconjunctivitis    Asthma    Hypothyroidism     Past Surgical History:  Procedure Laterality Date   biospy     Fibrocystic breast disease with biopsy   BREAST EXCISIONAL BIOPSY Left    BREAST REDUCTION SURGERY     CESAREAN SECTION     REDUCTION MAMMAPLASTY Bilateral    about 8 years ago    Review of systems negative except as noted in HPI / PMHx or noted below:  Review of Systems  Constitutional: Negative.   HENT: Negative.    Eyes: Negative.   Respiratory: Negative.    Cardiovascular: Negative.   Gastrointestinal: Negative.   Genitourinary: Negative.   Musculoskeletal: Negative.   Skin: Negative.   Neurological: Negative.   Endo/Heme/Allergies: Negative.   Psychiatric/Behavioral: Negative.  Objective:   Vitals:   07/13/24 1608  BP: 116/62  Pulse: 68  Resp: 12  SpO2: 96%          Physical Exam Constitutional:      Appearance: She is not diaphoretic.     Comments: Nasal voice  HENT:     Head: Normocephalic.     Right Ear: Tympanic membrane, ear canal and external ear normal.     Left Ear: Tympanic membrane, ear canal and external ear normal.     Nose: Mucosal edema (erythematous) present. No rhinorrhea.     Mouth/Throat:     Pharynx: Uvula midline. No oropharyngeal exudate.  Eyes:     Conjunctiva/sclera: Conjunctivae normal.  Neck:     Thyroid: No thyromegaly.     Trachea: Trachea  normal. No tracheal tenderness or tracheal deviation.  Cardiovascular:     Rate and Rhythm: Normal rate and regular rhythm.     Heart sounds: Normal heart sounds, S1 normal and S2 normal. No murmur heard. Pulmonary:     Effort: No respiratory distress.     Breath sounds: Normal breath sounds. No stridor. No wheezing or rales.  Lymphadenopathy:     Head:     Right side of head: No tonsillar adenopathy.     Left side of head: No tonsillar adenopathy.     Cervical: No cervical adenopathy.  Skin:    Findings: No erythema or rash.     Nails: There is no clubbing.  Neurological:     Mental Status: She is alert.     Diagnostics: Spirometry was performed and demonstrated an FEV1 of 1.90 at 67 % of predicted.  Assessment and Plan:   1. Asthma, not well controlled, moderate persistent, with acute exacerbation   2. Perennial allergic rhinitis   3. Viral respiratory infection    1. Continue to treat and prevent inflammation of airway:  A. Asmanex  220 twisthaler -  1 inhalation 1-2 times per day  B. Ryaltris  - 2 sprays each nostril 1-2 times per day  2. If needed:  A. AirSupra  - 2 inhalations every 4-6 hours  B. Albuterol  nebulization  C. Antihistamine   3. For this viral event:   A. Prednisone  10 mg - 1 tablet 1 time per day for 10 days  B. Lots of nasal saline  C. Start Asmanex  220 - 2 inhalations 2 times per day  D. Use Airsupra  - 2 inhalations every 4-6 hours if needed  E. Mucinex DM - 2 times per day if needed  4. Return to clinic in 1 year or earlier if problem  5. Influenza = Tamiflu. Covid = Paxlovid  Nathanel appears to have an acute viral respiratory tract infection and we will get her to utilize the plan noted above to address this issue and I suspect that she will do better over the course of the next 7 days or so.  If she does better she always has the opportunity to utilize her anti-inflammatory medications in a preventative manner for both her upper and lower airway  as noted above.  If she does not do better she will contact me for further evaluation and treatment.    Camellia Denis, MD Allergy  / Immunology [facility]

## 2024-07-13 NOTE — Patient Instructions (Signed)
  1. Continue to treat and prevent inflammation of airway:  A. Asmanex  220 twisthaler -  1 inhalation 1-2 times per day  B. Ryaltris  - 2 sprays each nostril 1-2 times per day  2. If needed:  A. AirSupra  - 2 inhalations every 4-6 hours  B. Albuterol  nebulization  C. Antihistamine   3. For this viral event:   A. Prednisone  10 mg - 1 tablet 1 time per day for 10 days  B. Lots of nasal saline  C. Start Asmanex  220 - 2 inhalations 2 times per day  D. Use Airsupra  - 2 inhalations every 4-6 hours if needed  E. Mucinex DM - 2 times per day if needed  4. Return to clinic in 1 year or earlier if problem  5. Influenza = Tamiflu. Covid = Paxlovid

## 2024-07-14 ENCOUNTER — Encounter: Payer: Self-pay | Admitting: Allergy and Immunology

## 2024-08-09 ENCOUNTER — Ambulatory Visit: Admitting: *Deleted

## 2024-08-09 VITALS — Ht 65.5 in | Wt 135.0 lb

## 2024-08-09 DIAGNOSIS — Z1211 Encounter for screening for malignant neoplasm of colon: Secondary | ICD-10-CM

## 2024-08-09 MED ORDER — NA SULFATE-K SULFATE-MG SULF 17.5-3.13-1.6 GM/177ML PO SOLN: 1.0000 | Freq: Once | ORAL | 0 refills | Status: AC

## 2024-08-09 NOTE — Addendum Note (Signed)
 Addended by: MALINDA ORIE CROME on: 08/09/2024 04:30 PM   Modules accepted: Orders

## 2024-08-09 NOTE — Progress Notes (Signed)
 Pt's name and DOB verified at the beginning of the pre-visit with 2 identifiers  Pt denies any difficulty with ambulating,sitting, laying down or rolling side to side  Pt has no issues moving head neck or swallowing  No egg or soy allergy  known to patient   No issues known to pt with past sedation  No FH of Malignant Hyperthermia  Pt is not on home 02   Pt is not on blood thinners   Pt denies issues with constipation   Pt is not on dialysis  Phx of tachycardia  Pt denies any upcoming cardiac testing  Patient's chart reviewed by Norleen Schillings CNRA prior to pre-visit and patient appropriate for the [facility].  Pre-visit completed and red dot placed by patient's name on their procedure day (on provider's schedule).    Visit by phone  Pt states weight is 135 lb  Pt given  both [facility] main # and MD on call # prior to instructions.  Informed pt to come in at the time discussed and is shown on PV instructions.  Pt instructed to use Singlecare.com or GoodRx for a price reduction on prep  Instructed pt where to find PV instructions in My Chart. . Instructed pt on all aspects of written instructions including med holds clothing to wear and foods to eat and not eat as well as after procedure legal restrictions and to call MD on call if needed.. Pt states understanding. Instructed pt to review instructions again prior to procedure and call main # given if has any questions or any issues. Pt states they will.

## 2024-08-22 ENCOUNTER — Encounter: Admitting: Gastroenterology

## 2024-09-30 ENCOUNTER — Encounter: Payer: Self-pay | Admitting: Gastroenterology

## 2024-10-10 ENCOUNTER — Other Ambulatory Visit: Admitting: Gastroenterology
